# Patient Record
Sex: Female | Born: 1960 | Race: White | Hispanic: No | Smoking: Never smoker
Health system: Southern US, Community
[De-identification: ages and names within clinical notes are randomized; demographics above are authoritative.]

## PROBLEM LIST (undated history)

## (undated) DIAGNOSIS — I749 Embolism and thrombosis of unspecified artery: Secondary | ICD-10-CM

## (undated) DIAGNOSIS — T7840XA Allergy, unspecified, initial encounter: Secondary | ICD-10-CM

## (undated) DIAGNOSIS — R232 Flushing: Secondary | ICD-10-CM

## (undated) DIAGNOSIS — F329 Major depressive disorder, single episode, unspecified: Secondary | ICD-10-CM

## (undated) DIAGNOSIS — M8580 Other specified disorders of bone density and structure, unspecified site: Secondary | ICD-10-CM

## (undated) DIAGNOSIS — F32A Depression, unspecified: Secondary | ICD-10-CM

## (undated) HISTORY — DX: Depression, unspecified: F32.A

## (undated) HISTORY — DX: Allergy, unspecified, initial encounter: T78.40XA

## (undated) HISTORY — DX: Embolism and thrombosis of unspecified artery: I74.9

---
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
## 1997-11-14 HISTORY — PX: ABDOMINAL HYSTERECTOMY: SHX81

## 2019-06-13 ENCOUNTER — Ambulatory Visit: Payer: Self-pay | Admitting: Nurse Practitioner

## 2019-06-28 ENCOUNTER — Ambulatory Visit (INDEPENDENT_AMBULATORY_CARE_PROVIDER_SITE_OTHER): Payer: BC Managed Care – PPO | Admitting: Psychology

## 2019-06-28 DIAGNOSIS — F4321 Adjustment disorder with depressed mood: Secondary | ICD-10-CM

## 2019-07-03 ENCOUNTER — Encounter: Payer: Self-pay | Admitting: Family Medicine

## 2019-07-03 ENCOUNTER — Ambulatory Visit (INDEPENDENT_AMBULATORY_CARE_PROVIDER_SITE_OTHER): Payer: BC Managed Care – PPO | Admitting: Family Medicine

## 2019-07-03 ENCOUNTER — Other Ambulatory Visit: Payer: Self-pay

## 2019-07-03 VITALS — BP 120/80 | HR 70 | Temp 98.1°F | Resp 16 | Ht 64.75 in | Wt 150.6 lb

## 2019-07-03 DIAGNOSIS — Z0000 Encounter for general adult medical examination without abnormal findings: Secondary | ICD-10-CM

## 2019-07-03 DIAGNOSIS — F430 Acute stress reaction: Secondary | ICD-10-CM

## 2019-07-03 DIAGNOSIS — I749 Embolism and thrombosis of unspecified artery: Secondary | ICD-10-CM | POA: Diagnosis not present

## 2019-07-03 DIAGNOSIS — E559 Vitamin D deficiency, unspecified: Secondary | ICD-10-CM | POA: Diagnosis not present

## 2019-07-03 DIAGNOSIS — R232 Flushing: Secondary | ICD-10-CM | POA: Diagnosis not present

## 2019-07-03 DIAGNOSIS — M8580 Other specified disorders of bone density and structure, unspecified site: Secondary | ICD-10-CM | POA: Diagnosis not present

## 2019-07-03 DIAGNOSIS — Z23 Encounter for immunization: Secondary | ICD-10-CM | POA: Diagnosis not present

## 2019-07-03 DIAGNOSIS — Z1239 Encounter for other screening for malignant neoplasm of breast: Secondary | ICD-10-CM

## 2019-07-03 HISTORY — DX: Flushing: R23.2

## 2019-07-03 HISTORY — DX: Other specified disorders of bone density and structure, unspecified site: M85.80

## 2019-07-03 LAB — CBC WITH DIFFERENTIAL/PLATELET
Basophils Absolute: 0.1 10*3/uL (ref 0.0–0.1)
Basophils Relative: 1.5 % (ref 0.0–3.0)
Eosinophils Absolute: 0.1 10*3/uL (ref 0.0–0.7)
Eosinophils Relative: 1.8 % (ref 0.0–5.0)
HCT: 38.6 % (ref 36.0–46.0)
Hemoglobin: 13 g/dL (ref 12.0–15.0)
Lymphocytes Relative: 32.7 % (ref 12.0–46.0)
Lymphs Abs: 1.2 10*3/uL (ref 0.7–4.0)
MCHC: 33.7 g/dL (ref 30.0–36.0)
MCV: 95.6 fl (ref 78.0–100.0)
Monocytes Absolute: 0.3 10*3/uL (ref 0.1–1.0)
Monocytes Relative: 9.4 % (ref 3.0–12.0)
Neutro Abs: 2 10*3/uL (ref 1.4–7.7)
Neutrophils Relative %: 54.6 % (ref 43.0–77.0)
Platelets: 226 10*3/uL (ref 150.0–400.0)
RBC: 4.03 Mil/uL (ref 3.87–5.11)
RDW: 13.4 % (ref 11.5–15.5)
WBC: 3.6 10*3/uL — ABNORMAL LOW (ref 4.0–10.5)

## 2019-07-03 LAB — COMPREHENSIVE METABOLIC PANEL
ALT: 14 U/L (ref 0–35)
AST: 15 U/L (ref 0–37)
Albumin: 4.8 g/dL (ref 3.5–5.2)
Alkaline Phosphatase: 86 U/L (ref 39–117)
BUN: 17 mg/dL (ref 6–23)
CO2: 29 mEq/L (ref 19–32)
Calcium: 9.8 mg/dL (ref 8.4–10.5)
Chloride: 106 mEq/L (ref 96–112)
Creatinine, Ser: 0.84 mg/dL (ref 0.40–1.20)
GFR: 69.97 mL/min (ref >60.00)
Glucose, Bld: 83 mg/dL (ref 70–99)
Potassium: 4.4 mEq/L (ref 3.5–5.1)
Sodium: 142 mEq/L (ref 135–145)
Total Bilirubin: 0.5 mg/dL (ref 0.2–1.2)
Total Protein: 6.9 g/dL (ref 6.0–8.3)

## 2019-07-03 LAB — LIPID PANEL
Cholesterol: 230 mg/dL — ABNORMAL HIGH (ref 0–200)
HDL: 67.5 mg/dL (ref >39.00)
LDL Cholesterol: 149 mg/dL — ABNORMAL HIGH (ref 0–99)
NonHDL: 162.06
Total CHOL/HDL Ratio: 3
Triglycerides: 64 mg/dL (ref 0.0–149.0)
VLDL: 12.8 mg/dL (ref 0.0–40.0)

## 2019-07-03 LAB — TSH: TSH: 1.41 u[IU]/mL (ref 0.35–4.50)

## 2019-07-03 LAB — VITAMIN D 25 HYDROXY (VIT D DEFICIENCY, FRACTURES): VITD: 15.06 ng/mL — ABNORMAL LOW (ref 30.00–100.00)

## 2019-07-03 NOTE — Progress Notes (Signed)
Subjective  Chief Complaint  Patient presents with   Establish Care    Moved from [facility], her PCP was Jocelyn Renfrow   Annual Exam    Last mammo was 2018.Marland Kitchen. Fasting, wants flu vaccine and ask about getting shingles vaccine   Headache    Reports that she is having 6-8 within a month. Takes Excedrin Migraine with relief   Depression    Recently started seeing a therapist    HPI: Tracy Zuniga is a 58 y.o. female who presents to [facility] [place] today for a Female Wellness Visit. She also has the concerns and/or needs as listed above in the chief complaint. These will be addressed in addition to the Health Maintenance Visit.   Wellness Visit: annual visit with health maintenance review and exam without Pap   Healthy 58 yo divorced mother of two grown children s/p partial hysterectomy presents to establish care and for cpe. Due mammogram. Flu shot. Healthy lifestyle. Adopted. Works full time and not currently in a relationship.  Chronic disease f/u and/or acute problem visit: (deemed necessary to be done in addition to the wellness visit):  Stress reaction: had difficult year: daughter recovered from mild substance abuse problem and divorced x 6 years. No h/o depression. No panic sxs. Feels she is doing well things but just wants counseling to sort through some things.   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 Neg coagulopathy workup. Doesn't interfere with sleep. Never thought about treating them. Thinks she can't take estrogens due to clotting history.   Allergies are mid  Headaches: no prior diagnosis or red flag sxs but frequent bitemporal   Assessment  1. Annual physical exam   2. Thromboembolism (HCC)   3. Vasomotor flushing   4. Stress reaction   5. Breast cancer screening   6. Osteopenia, unspecified location   7. Need for immunization against influenza      Plan  Female Wellness  Visit:  Age appropriate Health Maintenance and Prevention measures were discussed with patient. Included topics are cancer screening recommendations, ways to keep healthy (see AVS) including dietary and exercise recommendations, regular eye and dental care, use of seat belts, and avoidance of moderate alcohol use and tobacco use. Mammogram ordered.   BMI: discussed patient's BMI and encouraged positive lifestyle modifications to help get to or maintain a target BMI.  HM needs and immunizations were addressed and ordered. See below for orders. See HM and immunization section for updates. Flu shot today  Routine labs and screening tests ordered including cmp, cbc and lipids where appropriate.  Discussed recommendations regarding Vit D and calcium supplementation (see AVS). Discussed increasing supplements  Chronic disease management visit and/or acute problem visit:  Stress reaction: agree with counseling. No mood disorder.   Hot flushes: trial of otc black cohosh. Consider ssri or gabapentin if needed.   Get old records to clarify embolism history.   Follow up: Return in about 1 year (around 07/02/2020) for complete physical.  Orders Placed This Encounter  Procedures   MM DIGITAL SCREENING BILATERAL   Flu Vaccine QUAD 36+ mos IM   CBC with Differential/Platelet   Comprehensive metabolic panel   Lipid panel   HIV Antibody (routine testing w rflx)   Hepatitis C antibody   VITAMIN D 25 Hydroxy (Vit-D Deficiency, Fractures)   TSH   No orders of the defined types were placed in this encounter.     Lifestyle:  Body mass index is 25.26 kg/m. Wt Readings from Last 3 Encounters:  07/03/19 150 lb 9.6 oz (68.3 kg)    Patient Active Problem List   Diagnosis Date Noted   Vasomotor flushing 07/03/2019   Osteopenia 07/03/2019   Thromboembolism (HCC)     colonic, due to birth control pill    Health Maintenance  Topic Date Due   Hepatitis C Screening  February 04, 1961   HIV  Screening  11/01/1977   MAMMOGRAM  11/14/2018   INFLUENZA VACCINE  06/15/2019   COLONOSCOPY  11/14/2022   TETANUS/TDAP  11/14/2025   Immunization History  Administered Date(s) Administered   Influenza,inj,Quad PF,6+ Mos 07/03/2019   We updated and reviewed the patient's past history in detail and it is documented below. Allergies: Patient has No Known Allergies. Past Medical History Patient  has a past medical history of Allergy, Depression, Osteopenia (07/03/2019), Thromboembolism (HCC), and Vasomotor flushing (07/03/2019). Past Surgical History Patient  has a past surgical history that includes Abdominal hysterectomy (1999). Family History: Patient family history includes Drug abuse in her daughter; Healthy in her son. She was adopted. Social History:  Patient  reports that she has never smoked. She has never used smokeless tobacco. She reports current alcohol use. She reports that she does not use drugs.  Review of Systems: Constitutional: negative for fever or malaise Ophthalmic: negative for photophobia, double vision or loss of vision Cardiovascular: negative for chest pain, dyspnea on exertion, or new LE swelling Respiratory: negative for SOB or persistent cough Gastrointestinal: negative for abdominal pain, change in bowel habits or melena Genitourinary: negative for dysuria or gross hematuria, no abnormal uterine bleeding or disharge Musculoskeletal: negative for new gait disturbance or muscular weakness Integumentary: negative for new or persistent rashes, no breast lumps Neurological: negative for TIA or stroke symptoms Psychiatric: negative for SI or delusions Allergic/Immunologic: negative for hives  Patient Care Team    Relationship Specialty Notifications Start End  Willow OraAndy, Caio Devera L, MD PCP - General Family Medicine  07/03/19     Objective  Vitals: BP 120/80    Pulse 70    Temp 98.1 F (36.7 C) (Oral)    Resp 16    Ht 5' 4.75" (1.645 m)    Wt 150 lb 9.6 oz  (68.3 kg)    SpO2 98%    BMI 25.26 kg/m  General:  Well developed, well nourished, no acute distress  Psych:  Alert and orientedx3,normal mood and affect HEENT:  Normocephalic, atraumatic, non-icteric sclera, PERRL, oropharynx is clear without mass or exudate, supple neck without adenopathy, mass or thyromegaly Cardiovascular:  Normal S1, S2, RRR without gallop, rub or murmur, nondisplaced PMI Respiratory:  Good breath sounds bilaterally, CTAB with normal respiratory effort Gastrointestinal: normal bowel sounds, soft, non-tender, no noted masses. No HSM MSK: no deformities, contusions. Joints are without erythema or swelling. Spine and CVA region are nontender Skin:  Warm, no rashes or suspicious lesions noted Neurologic:    Mental status is normal. CN 2-11 are normal. Gross motor and sensory exams are normal. Normal gait. No tremor Breast Exam: No mass, skin retraction or nipple discharge is appreciated in either breast. No axillary adenopathy. Fibrocystic changes are not noted    Commons side effects, risks, benefits, and alternatives for medications and treatment plan prescribed today were discussed, and the patient expressed understanding of the given instructions. Patient is instructed to call or message via MyChart if he/she has any questions or concerns regarding our treatment plan. No barriers to understanding were identified.  We discussed Red Flag symptoms and signs in detail. Patient expressed understanding regarding what to do in case of urgent or emergency type symptoms.   Medication list was reconciled, printed and provided to the patient in AVS. Patient instructions and summary information was reviewed with the patient as documented in the AVS. This note was prepared with assistance of Dragon voice recognition software. Occasional wrong-word or sound-a-like substitutions may have occurred due to the inherent limitations of voice recognition software

## 2019-07-03 NOTE — Patient Instructions (Addendum)
Please return in 12 months for your annual complete physical; please come fasting. You may make an appointment to further evaluate your headaches and hot flushes if needed.  Try otc Black Cohosh twice a day to see if that helps.   Please sign up for mychart.  I will release your lab results to you on your MyChart account with further instructions. Please reply with any questions.   We will call you with information regarding your referral appointment. Mammogram.  If you do not hear from [place] within the next 2 weeks, please let me know. It can take 1-2 weeks to get appointments set up with the specialists.   It was a pleasure meeting you today! Thank you for choosing [place] to meet your healthcare needs! I truly look forward to working with you. If you have any questions or concerns, please send me a message via Mychart or call the office at [phone].   Calcium Intake Recommendations You can take Caltrate Plus twice a day or get it through your diet or other OTC supplements (Viactiv, OsCal etc)  Calcium is a mineral that affects many functions in the body, including:  Blood clotting.  Blood vessel function.  Nerve impulse conduction.  Hormone secretion.  Muscle contraction.  Bone and teeth functions.  Most of your body's calcium supply is stored in your bones and teeth. When your calcium stores are low, you may be at risk for low bone mass, bone loss, and bone fractures. Consuming enough calcium helps to grow healthy bones and teeth and to prevent breakdown over time. It is very important that you get enough calcium if you are:  A child undergoing rapid growth.  An adolescent girl.  A pre- or post-menopausal woman.  A woman whose menstrual cycle has stopped due to anorexia nervosa or regular intense exercise.  An individual with lactose intolerance or a milk allergy.  A vegetarian.  What is my plan? Try to consume the recommended amount of calcium daily based on your age.  Depending on your overall health, your health care provider may recommend increased calcium intake.General daily calcium intake recommendations by age are:  Birth to 6 months: 200 mg.  Infants 7 to 12 months: 260 mg.  Children 1 to 3 years: 700 mg.  Children 4 to 8 years: 1,000 mg.  Children 9 to 13 years: 1,300 mg.  Teens 14 to 18 years: 1,300 mg.  Adults 19 to 50 years: 1,000 mg.  Adult women 51 to 70 years: 1,200 mg.  Adult men 51 to 70 years: 1,000 mg.  Adults 71 years and older: 1,200 mg.  Pregnant and breastfeeding teens: 1,300 mg.  Pregnant and breastfeeding adults: 1,000 mg.  What do I need to know about calcium intake?  In order for the body to absorb calcium, it needs vitamin D. You can get vitamin D through (we recommend getting [phone] units of Vitamin D daily) ? Direct exposure of the skin to sunlight. ? Foods, such as egg yolks, liver, saltwater fish, and fortified milk. ? Supplements.  Consuming too much calcium may cause: ? Constipation. ? Decreased absorption of iron and zinc. ? Kidney stones.  Calcium supplements may interact with certain medicines. Check with your health care provider before starting any calcium supplements.  Try to get most of your calcium from food. What foods can I eat? Grains  Fortified oatmeal. Fortified ready-to-eat cereals. Fortified frozen waffles. Vegetables Turnip greens. Broccoli. Fruits Fortified orange juice. Meats and Other Protein Sources Canned sardines  with bones. Canned salmon with bones. Soy beans. Tofu. Baked beans. Almonds. [place] nuts. Sunflower seeds. Dairy Milk. Yogurt. Cheese. Cottage cheese. Beverages Fortified soy milk. Fortified rice milk. Sweets/Desserts Pudding. Ice Cream. Milkshakes. Blackstrap molasses. The items listed above may not be a complete list of recommended foods or beverages. Contact your dietitian for more options. What foods can affect my calcium intake? It may be more  difficult for your body to use calcium or calcium may leave your body more quickly if you consume large amounts of:  Sodium.  Protein.  Caffeine.  Alcohol.  This information is not intended to replace advice given to you by your health care provider. Make sure you discuss any questions you have with your health care provider. Document Released: 06/14/2004 Document Revised: 05/20/2016 Document Reviewed: 04/08/2014 Elsevier Interactive Patient Education  2018 Elsevier Inc.   Preventive Care 14-49 Years Old, Female Preventive care refers to visits with your health care provider and lifestyle choices that can promote health and wellness. This includes:  A yearly physical exam. This may also be called an annual well check.  Regular dental visits and eye exams.  Immunizations.  Screening for certain conditions.  Healthy lifestyle choices, such as eating a healthy diet, getting regular exercise, not using drugs or products that contain nicotine and tobacco, and limiting alcohol use. What can I expect for my preventive care visit? Physical exam Your health care provider will check your:  Height and weight. This may be used to calculate body mass index (BMI), which tells if you are at a healthy weight.  Heart rate and blood pressure.  Skin for abnormal spots. Counseling Your health care provider may ask you questions about your:  Alcohol, tobacco, and drug use.  Emotional well-being.  Home and relationship well-being.  Sexual activity.  Eating habits.  Work and work Statistician.  Method of birth control.  Menstrual cycle.  Pregnancy history. What immunizations do I need?  Influenza (flu) vaccine  This is recommended every year. Tetanus, diphtheria, and pertussis (Tdap) vaccine  You may need a Td booster every 10 years. Varicella (chickenpox) vaccine  You may need this if you have not been vaccinated. Zoster (shingles) vaccine  You may need this after age  30. Measles, mumps, and rubella (MMR) vaccine  You may need at least one dose of MMR if you were born in 1957 or later. You may also need a second dose. Pneumococcal conjugate (PCV13) vaccine  You may need this if you have certain conditions and were not previously vaccinated. Pneumococcal polysaccharide (PPSV23) vaccine  You may need one or two doses if you smoke cigarettes or if you have certain conditions. Meningococcal conjugate (MenACWY) vaccine  You may need this if you have certain conditions. Hepatitis A vaccine  You may need this if you have certain conditions or if you travel or work in places where you may be exposed to hepatitis A. Hepatitis B vaccine  You may need this if you have certain conditions or if you travel or work in places where you may be exposed to hepatitis B. Haemophilus influenzae type b (Hib) vaccine  You may need this if you have certain conditions. Human papillomavirus (HPV) vaccine  If recommended by your health care provider, you may need three doses over 6 months. You may receive vaccines as individual doses or as more than one vaccine together in one shot (combination vaccines). Talk with your health care provider about the risks and benefits of combination vaccines. What tests  do I need? Blood tests  Lipid and cholesterol levels. These may be checked every 5 years, or more frequently if you are over 14 years old.  Hepatitis C test.  Hepatitis B test. Screening  Lung cancer screening. You may have this screening every year starting at age 68 if you have a 30-pack-year history of smoking and currently smoke or have quit within the past 15 years.  Colorectal cancer screening. All adults should have this screening starting at age 85 and continuing until age 25. Your health care provider may recommend screening at age 52 if you are at increased risk. You will have tests every 1-10 years, depending on your results and the type of screening  test.  Diabetes screening. This is done by checking your blood sugar (glucose) after you have not eaten for a while (fasting). You may have this done every 1-3 years.  Mammogram. This may be done every 1-2 years. Talk with your health care provider about when you should start having regular mammograms. This may depend on whether you have a family history of breast cancer.  BRCA-related cancer screening. This may be done if you have a family history of breast, ovarian, tubal, or peritoneal cancers.  Pelvic exam and Pap test. This may be done every 3 years starting at age 84. Starting at age 4, this may be done every 5 years if you have a Pap test in combination with an HPV test. Other tests  Sexually transmitted disease (STD) testing.  Bone density scan. This is done to screen for osteoporosis. You may have this scan if you are at high risk for osteoporosis. Follow these instructions at home: Eating and drinking  Eat a diet that includes fresh fruits and vegetables, whole grains, lean protein, and low-fat dairy.  Take vitamin and mineral supplements as recommended by your health care provider.  Do not drink alcohol if: ? Your health care provider tells you not to drink. ? You are pregnant, may be pregnant, or are planning to become pregnant.  If you drink alcohol: ? Limit how much you have to 0-1 drink a day. ? Be aware of how much alcohol is in your drink. In the [place] one drink equals one 12 oz bottle of beer (355 mL), one 5 oz glass of wine (148 mL), or one 1 oz glass of hard liquor (44 mL). Lifestyle  Take daily care of your teeth and gums.  Stay active. Exercise for at least 30 minutes on 5 or more days each week.  Do not use any products that contain nicotine or tobacco, such as cigarettes, e-cigarettes, and chewing tobacco. If you need help quitting, ask your health care provider.  If you are sexually active, practice safe sex. Use a condom or other form of birth control  (contraception) in order to prevent pregnancy and STIs (sexually transmitted infections).  If told by your health care provider, take low-dose aspirin daily starting at age 61. What's next?  Visit your health care provider once a year for a well check visit.  Ask your health care provider how often you should have your eyes and teeth checked.  Stay up to date on all vaccines. This information is not intended to replace advice given to you by your health care provider. Make sure you discuss any questions you have with your health care provider. Document Released: 11/27/2015 Document Revised: 07/12/2018 Document Reviewed: 07/12/2018 Elsevier Patient Education  2020 [employer].

## 2019-07-04 LAB — HIV ANTIBODY (ROUTINE TESTING W REFLEX): HIV 1&2 Ab, 4th Generation: NONREACTIVE

## 2019-07-04 LAB — HEPATITIS C ANTIBODY
Hepatitis C Ab: NONREACTIVE
SIGNAL TO CUT-OFF: 0.01 (ref <1.00)

## 2019-07-04 MED ORDER — VITAMIN D (ERGOCALCIFEROL) 1.25 MG (50000 UNIT) PO CAPS: 50000.0000 [IU] | ORAL_CAPSULE | ORAL | 0 refills | Status: AC

## 2019-07-04 NOTE — Addendum Note (Signed)
Addended by: Billey Chang on: 07/04/2019 04:28 PM   Modules accepted: Orders

## 2019-07-12 ENCOUNTER — Ambulatory Visit (INDEPENDENT_AMBULATORY_CARE_PROVIDER_SITE_OTHER): Payer: 59 | Admitting: Psychology

## 2019-07-12 DIAGNOSIS — F4321 Adjustment disorder with depressed mood: Secondary | ICD-10-CM

## 2019-07-18 ENCOUNTER — Telehealth: Payer: Self-pay | Admitting: Family Medicine

## 2019-07-18 NOTE — Telephone Encounter (Signed)
Patient is calling to receive a referral for a mamogram.   Patient will back on Tuesday to Follow up Holly- [phone]

## 2019-07-19 ENCOUNTER — Other Ambulatory Visit: Payer: Self-pay | Admitting: *Deleted

## 2019-07-19 DIAGNOSIS — Z1239 Encounter for other screening for malignant neoplasm of breast: Secondary | ICD-10-CM

## 2019-07-19 NOTE — Telephone Encounter (Signed)
Did you try to call pt about mammogram?

## 2019-07-19 NOTE — Telephone Encounter (Signed)
I did not call her.  Looking at the order, she will not receive a call about the mammogram from anyone because the preferred location was put in as External.  This needs to be changed to [facility] ( or where ever it is that she wants to go.)

## 2019-07-19 NOTE — Telephone Encounter (Signed)
Order fixed.  

## 2019-07-26 ENCOUNTER — Ambulatory Visit (INDEPENDENT_AMBULATORY_CARE_PROVIDER_SITE_OTHER): Payer: 59 | Admitting: Psychology

## 2019-07-26 DIAGNOSIS — F4321 Adjustment disorder with depressed mood: Secondary | ICD-10-CM

## 2019-08-02 ENCOUNTER — Ambulatory Visit (HOSPITAL_COMMUNITY)
Admission: EM | Admit: 2019-08-02 | Discharge: 2019-08-02 | Disposition: A | Discharge status: Home / Self Care | Payer: BC Managed Care – PPO | Attending: Family Medicine | Admitting: Family Medicine

## 2019-08-02 ENCOUNTER — Ambulatory Visit (INDEPENDENT_AMBULATORY_CARE_PROVIDER_SITE_OTHER): Payer: BC Managed Care – PPO

## 2019-08-02 ENCOUNTER — Encounter (HOSPITAL_COMMUNITY): Payer: Self-pay

## 2019-08-02 ENCOUNTER — Ambulatory Visit (HOSPITAL_COMMUNITY): Payer: BC Managed Care – PPO

## 2019-08-02 DIAGNOSIS — S43421A Sprain of right rotator cuff capsule, initial encounter: Secondary | ICD-10-CM

## 2019-08-02 DIAGNOSIS — S4991XA Unspecified injury of right shoulder and upper arm, initial encounter: Secondary | ICD-10-CM | POA: Diagnosis not present

## 2019-08-02 DIAGNOSIS — M25511 Pain in right shoulder: Secondary | ICD-10-CM | POA: Diagnosis not present

## 2019-08-02 MED ORDER — PREDNISONE 20 MG PO TABS: 20.0000 mg | ORAL_TABLET | Freq: Two times a day (BID) | ORAL | 0 refills | Status: AC

## 2019-08-02 MED ORDER — NAPROXEN 500 MG PO TABS: 500.0000 mg | ORAL_TABLET | Freq: Two times a day (BID) | ORAL | 0 refills | Status: AC

## 2019-08-02 NOTE — ED Triage Notes (Signed)
Patient report she fall on her right side 2 weeks ago,  she was walking her dog, she have have right arm and right shoulder pain, her range of motion is limited because of the pain.

## 2019-08-02 NOTE — Discharge Instructions (Addendum)
Take prednisone 2 times a day for 5 days.  Take 2 doses today When finished with prednisone take Naprosyn 3 times a day.  Take with food Take Naprosyn until the pain in your shoulder is improved Do rotator cuff stretching and exercises as tolerated Follow-up with an orthopedic if you fail to improve

## 2019-08-02 NOTE — ED Provider Notes (Signed)
[place]    CSN: [id number] Arrival date & time: 08/02/19  1021      History   Chief Complaint Chief Complaint  Patient presents with  . Arm Pain    HPI Tracy Zuniga is a 58 y.o. female.   HPI Patient was walking a large dog 2 weeks ago when it took off and she was holding the leash in her right hand.  It pulled her forward and she landed on her elbow and injured her elbow and shoulder.  She has a healing wound on her elbow.  She still has pain in her shoulder.  She still has limited shoulder movement.  It hurts on the top of her shoulder down to the mid deltoid region. Normally healthy.  She does not have any problems with her shoulder previously  Past Medical History:  Diagnosis Date  . Allergy   . Depression   . Osteopenia 07/03/2019  . 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 Vasomotor flushing 07/03/2019    Patient Active Problem List   Diagnosis Date Noted  . Vasomotor flushing 07/03/2019  . Osteopenia 07/03/2019  . Thromboembolism [facility])     Past Surgical History:  Procedure Laterality Date  . ABDOMINAL HYSTERECTOMY  1999   Partial    OB History   No obstetric history on file.      Home Medications    Prior to Admission medications   Medication Sig Start Date End Date Taking? Authorizing Provider  ibuprofen (ADVIL) 200 MG tablet Take 200 mg by mouth every 6 (six) hours as needed.   Yes [provider]  Multiple Vitamin (MULTIVITAMIN) tablet Take 1 tablet by mouth daily.   Yes [provider]  Vitamin D, Ergocalciferol, (DRISDOL) 1.25 MG (50000 UT) CAPS capsule Take 1 capsule (50,000 Units total) by mouth once a week. 07/04/19 10/02/19  Leamon Arnt, MD    Family History Family History  Adopted: Yes  Problem Relation Age of Onset  . Drug abuse Daughter   . Healthy Son     Social History Social History   Tobacco Use  . Smoking status: Never Smoker  . Smokeless tobacco: Never Used   Substance Use Topics  . Alcohol use: Yes  . Drug use: Never     Allergies   Patient has no known allergies.   Review of Systems Review of Systems  Constitutional: Negative for chills and fever.  HENT: Negative for ear pain and sore throat.   Eyes: Negative for pain and visual disturbance.  Respiratory: Negative for cough and shortness of breath.   Cardiovascular: Negative for chest pain and palpitations.  Gastrointestinal: Negative for abdominal pain and vomiting.  Genitourinary: Negative for dysuria and hematuria.  Musculoskeletal: Positive for arthralgias. Negative for back pain.  Skin: Negative for color change and rash.  Neurological: Negative for seizures and syncope.  All other systems reviewed and are negative.    Physical Exam Triage Vital Signs ED Triage Vitals  Enc Vitals Group     BP 08/02/19 1032 133/84     Pulse Rate 08/02/19 1032 80     Resp 08/02/19 1032 16     Temp 08/02/19 1032 98 F (36.7 C)     Temp Source 08/02/19 1032 Temporal     SpO2 08/02/19 1032 99 %     Weight --      Height --      Head Circumference --      Peak  Flow --      Pain Score 08/02/19 1035 6     Pain Loc --      Pain Edu? --      Excl. in GC? --    No data found.  Updated Vital Signs BP 133/84 (BP Location: Left Arm)   Pulse 80   Temp 98 F (36.7 C) (Temporal)   Resp 16   SpO2 99%       Physical Exam Constitutional:      General: She is not in acute distress.    Appearance: She is well-developed.  HENT:     Head: Normocephalic and atraumatic.  Eyes:     Conjunctiva/sclera: Conjunctivae normal.     Pupils: Pupils are equal, round, and reactive to light.  Neck:     Musculoskeletal: Normal range of motion.  Cardiovascular:     Rate and Rhythm: Normal rate.  Pulmonary:     Effort: Pulmonary effort is normal. No respiratory distress.  Abdominal:     General: There is no distension.     Palpations: Abdomen is soft.  Musculoskeletal: Normal range of motion.      Comments: Right shoulder is not swollen.  There is no tenderness around the joint.  No tenderness over the [facility] joint or clavicle.  She can abduct to 90 degrees.  She has full external rotation but very little internal rotation.  Negative impingement.  Elbow wrist and hand exam is normal.  Skin:    General: Skin is warm and dry.  Neurological:     Mental Status: She is alert.      UC Treatments / Results  Labs (all labs ordered are listed, but only abnormal results are displayed) Labs Reviewed - No data to display  EKG   Radiology Dg Shoulder Right  Result Date: 08/02/2019 CLINICAL DATA:  Right shoulder pain after fall 2 weeks ago EXAM: RIGHT SHOULDER - 2+ VIEW COMPARISON:  None. FINDINGS: There is no evidence of fracture or dislocation. There is no evidence of arthropathy or other focal bone abnormality. Soft tissues are unremarkable. IMPRESSION: Negative. Electronically Signed   By: Lupita RaiderJames  Green Jr M.D.   On: 08/02/2019 11:35    Procedures Procedures (including critical care time)  Medications Ordered in UC Medications - No data to display  Initial Impression / Assessment and Plan / UC Course  I have reviewed the triage vital signs and the nursing notes.  Pertinent labs & imaging results that were available during my care of the patient were reviewed by me and considered in my medical decision making (see chart for details).     Patient is given a rotator cuff strain handout with exercises recommended.  These are demonstrated to her and discussed. Final Clinical Impressions(s) / UC Diagnoses   Final diagnoses:  Sprain of right rotator cuff capsule, initial encounter     Discharge Instructions     Take prednisone 2 times a day for 5 days.  Take 2 doses today When finished with prednisone take Naprosyn 3 times a day.  Take with food Take Naprosyn until the pain in your shoulder is improved Do rotator cuff stretching and exercises as tolerated Follow-up with an  orthopedic if you fail to improve    ED Prescriptions    None     PDMP not reviewed this encounter.   Eustace MooreNelson, Azai Gaffin Sue, MD 08/02/19 [id number]

## 2019-08-09 ENCOUNTER — Ambulatory Visit (INDEPENDENT_AMBULATORY_CARE_PROVIDER_SITE_OTHER): Payer: 59 | Admitting: Psychology

## 2019-08-09 DIAGNOSIS — F4321 Adjustment disorder with depressed mood: Secondary | ICD-10-CM

## 2019-08-22 ENCOUNTER — Ambulatory Visit (INDEPENDENT_AMBULATORY_CARE_PROVIDER_SITE_OTHER): Payer: 59 | Admitting: Psychology

## 2019-08-22 DIAGNOSIS — F4321 Adjustment disorder with depressed mood: Secondary | ICD-10-CM | POA: Diagnosis not present

## 2019-09-05 ENCOUNTER — Ambulatory Visit (INDEPENDENT_AMBULATORY_CARE_PROVIDER_SITE_OTHER): Payer: 59 | Admitting: Psychology

## 2019-09-05 DIAGNOSIS — F4321 Adjustment disorder with depressed mood: Secondary | ICD-10-CM | POA: Diagnosis not present

## 2019-09-17 ENCOUNTER — Ambulatory Visit (INDEPENDENT_AMBULATORY_CARE_PROVIDER_SITE_OTHER): Payer: BC Managed Care – PPO | Admitting: Orthopaedic Surgery

## 2019-09-17 ENCOUNTER — Other Ambulatory Visit: Payer: Self-pay

## 2019-09-17 ENCOUNTER — Encounter: Payer: Self-pay | Admitting: Orthopaedic Surgery

## 2019-09-17 DIAGNOSIS — G8929 Other chronic pain: Secondary | ICD-10-CM

## 2019-09-17 DIAGNOSIS — M25511 Pain in right shoulder: Secondary | ICD-10-CM

## 2019-09-17 MED ORDER — METHYLPREDNISOLONE ACETATE 40 MG/ML IJ SUSP
40.0000 mg | INTRAMUSCULAR | Status: AC | PRN
  Administered 2019-09-17: 40 mg via INTRA_ARTICULAR

## 2019-09-17 MED ORDER — BUPIVACAINE HCL 0.25 % IJ SOLN
2.0000 mL | INTRAMUSCULAR | Status: AC | PRN
  Administered 2019-09-17: 2 mL via INTRA_ARTICULAR

## 2019-09-17 MED ORDER — LIDOCAINE HCL 2 % IJ SOLN
2.0000 mL | INTRAMUSCULAR | Status: AC | PRN
  Administered 2019-09-17: 2 mL

## 2019-09-17 NOTE — Progress Notes (Signed)
Office Visit Note   Patient: Tracy Zuniga           Date of Birth: [birth year]           MRN: [id number] Visit Date: 09/17/2019              Requested by: Leamon Arnt, [place],  [place] [address] PCP: Leamon Arnt, MD   Assessment & Plan: Visit Diagnoses:  1. Chronic right shoulder pain     Plan: Impression is right shoulder subacromial bursitis possible partial rotator cuff tear with relatively well preserved strength.  We will inject this with cortisone today.  Will also send her to outpatient PT.  She will follow-up with [place] as needed.  Call with concerns or questions in the meantime.  Follow-Up Instructions: Return if symptoms worsen or fail to improve.   Orders:  Orders Placed This Encounter  Procedures   Large Joint Inj: R subacromial bursa   No orders of the defined types were placed in this encounter.     Procedures: Large Joint Inj: R subacromial bursa on 09/17/2019 10:04 AM Indications: pain Details: 22 G needle Medications: 2 mL bupivacaine 0.25 %; 2 mL lidocaine 2 %; 40 mg methylPREDNISolone acetate 40 MG/ML Outcome: tolerated well, no immediate complications Patient was prepped and draped in the usual sterile fashion.       Clinical Data: No additional findings.   Subjective: Chief Complaint  Patient presents with   Right Shoulder - Pain    HPI patient is a pleasant 58 year old female who comes in today with right shoulder pain.  Approximately 2 months ago she was walking her daughter's dog holding the leash when the dog jerked forward pulling her right arm and causing her to fall onto her right elbow.  She has had pain to the right shoulder since.  The pain actually is primarily into the deltoid.  She has increased pain with internal and external rotation as well as forward flexion.  She has been taking naproxen with good relief of symptoms.  No numbness, tingling or burning.  She was initially seen at the urgent care  where x-rays were obtained.  No acute findings there.  She comes in today for further evaluation.  Review of Systems as detailed in HPI.  All others reviewed and are negative.   Objective: Vital Signs: There were no vitals taken for this visit.  Physical Exam well-developed well-nourished female no acute distress.  Alert and oriented x3.  Ortho Exam examination of the right shoulder reveals full active range of motion all planes.  She has increased pain with external rotation and abduction.  Minimally positive empty can.  Negative cross body.  No tenderness to the bicipital groove.  She is neurovascularly intact distally.  Specialty Comments:  No specialty comments available.  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

## 2019-09-18 ENCOUNTER — Encounter: Payer: Self-pay | Admitting: Physical Therapy

## 2019-09-18 ENCOUNTER — Ambulatory Visit (INDEPENDENT_AMBULATORY_CARE_PROVIDER_SITE_OTHER): Payer: BC Managed Care – PPO | Admitting: Physical Therapy

## 2019-09-18 DIAGNOSIS — R293 Abnormal posture: Secondary | ICD-10-CM | POA: Diagnosis not present

## 2019-09-18 DIAGNOSIS — M25511 Pain in right shoulder: Secondary | ICD-10-CM

## 2019-09-18 DIAGNOSIS — M6281 Muscle weakness (generalized): Secondary | ICD-10-CM

## 2019-09-18 NOTE — Therapy (Signed)
[facility] Physical Therapy [address] [place], [place], [address] Phone: [phone]   Fax:  [phone]  Physical Therapy Evaluation  Patient Details  Name: Tyrell Brereton MRN: [id number] Date of Birth: [birth year] Referring Provider (PT): Tarry Kos, MD   Encounter Date: 09/18/2019  PT End of Session - 09/18/19 1254    Visit Number  1    Number of Visits  12    Date for PT Re-Evaluation  10/30/19    PT Start Time  0850    PT Stop Time  0928    PT Time Calculation (min)  38 min    Activity Tolerance  Patient tolerated treatment well    Behavior During Therapy  [facility] for tasks assessed/performed       Past Medical History:  Diagnosis Date  . Allergy   . Depression   . Osteopenia 07/03/2019  . Thromboembolism ([facility])    colonic, due to birth control pill  . Vasomotor flushing 07/03/2019    Past Surgical History:  Procedure Laterality Date  . ABDOMINAL HYSTERECTOMY  1999   Partial    There were no vitals filed for this visit.   Subjective Assessment - 09/18/19 0853    Subjective  Pt is a 58 y/o female who presents to OPPT with Rt shoulder pain x 2 months.  Pt reports she was walking her daughter's dog when the dog pulled on her arm causing her to fall.  She had injection yesterday, which has helped with the pain some.    Patient Stated Goals  improve pain; restore normal function of Rt arm    Currently in Pain?  Yes    Pain Score  0-No pain   up to 7/10   Pain Location  Shoulder    Pain Orientation  Right    Pain Descriptors / Indicators  Shooting;Aching;Dull    Pain Type  Acute pain    Pain Onset  More than a month ago    Pain Frequency  Intermittent    Aggravating Factors   overhead reaching, external rotation activities, carrying heavy items (heavy tote bag)    Pain Relieving Factors  prednisone, injection         [facility] PT Assessment - 09/18/19 0859      Assessment   Medical Diagnosis  M25.511,G89.29 (ICD-10-CM) - Chronic right  shoulder pain    Referring Provider (PT)  Tarry Kos, MD    Onset Date/Surgical Date  --   2 months ago   Hand Dominance  Right    Next MD Visit  PRN    Prior Therapy  none      Precautions   Precautions  None      Restrictions   Weight Bearing Restrictions  No      Balance Screen   Has the patient fallen in the past 6 months  Yes    How many times?  1    Has the patient had a decrease in activity level because of a fear of falling?   No    Is the patient reluctant to leave their home because of a fear of falling?   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    Leisure  spend time  with children, crafts; no regular exercise      Cognition   Overall Cognitive Status  Within Functional Limits for tasks assessed    Area of Impairment  --      Posture/Postural Control   Posture/Postural Control  Postural limitations    Postural Limitations  Rounded Shoulders;Forward head    Posture Comments  Rt mild scapular winging      ROM / Strength   AROM / PROM / Strength  AROM;Strength      AROM   Overall AROM Comments  bil shoulders grossly WNL; mild discomfort and lacking end range flexion on Rt      Strength   Strength Assessment Site  Shoulder    Right/Left Shoulder  Right    Right Shoulder Flexion  4/5    Right Shoulder ABduction  3+/5   with pain   Right Shoulder Internal Rotation  5/5    Right Shoulder External Rotation  4/5      Palpation   Palpation comment  trigger points noted in Rt deltoids, infraspinatus and teres minor; large trigger point noted in upper trap with likely trigger points into supraspinatus      Special Tests    Special Tests  Rotator Cuff Impingement    Rotator Cuff Impingment tests  Michel Bickers test;Empty Can test;Full Can test       Hawkins-Kennedy test   Findings  Negative    Side  Right      Empty Can test   Findings  Positive    Side  Right      Full Can test   Findings  Positive    Side  Right                Objective measurements completed on examination: See above findings.      [facility] Adult PT Treatment/Exercise - 09/18/19 0859      Exercises   Exercises  Shoulder      Shoulder Exercises: Seated   Retraction  5 reps    Retraction Limitations  5 sec    External Rotation  Both;5 reps    External Rotation Limitations  3-5 sec hold    Other Seated Exercises  backward shoulder rolls x 5 reps      Shoulder Exercises: Standing   External Rotation  Both;5 reps;Theraband    Theraband Level (Shoulder External Rotation)  Level 1 (Yellow)    Row  Both;5 reps;Theraband    Theraband Level (Shoulder Row)  Level 1 (Yellow)      Shoulder Exercises: Stretch   Other Shoulder Stretches  low doorway stretch 1x30 sec; attempted mid - too painful at this time             PT Education - 09/18/19 1254    Education Details  HEP, DN    Person(s) Educated  Patient    Methods  Explanation;Demonstration;Handout    Comprehension  Verbalized understanding;Returned demonstration;Need further instruction          PT Long Term Goals - 09/18/19 1259      PT LONG TERM GOAL #1   Title  independent with HEP    Status  New    Target Date  10/30/19      PT LONG TERM GOAL #2   Title  perform Rt shoulder AROM without pain for improved function    Status  New    Target Date  10/30/19      PT LONG TERM GOAL #3   Title  report no difficulty with carrying > 15# for improved function    Status  New    Target Date  10/30/19      PT LONG TERM GOAL #4   Title  report pain < 3/10 with overhead reaching and external rotation with abduction for improved function    Status  New    Target Date  10/30/19      PT LONG TERM GOAL #5   Title  demonstrate 4/5 Rt shoulder strength for improved function     Status  New    Target Date  10/30/19             Plan - 09/18/19 1255    Clinical Impression Statement  Pt is a 58 y/o female who presents to OPPT for Rt shoulder pain, most consistent with rotator cuff syndrome involving supraspinatus.  Pt demonstrates increased pain, postural abnormalities, and decreased strength affecting functional mobility.  Pt will benefit from PT to address deficits listed.    Examination-Activity Limitations  Reach Overhead;Carry;Toileting;Dressing;Hygiene/Grooming    Examination-Participation Restrictions  Driving;Other   occupation   Stability/Clinical Decision Making  Stable/Uncomplicated    Clinical Decision Making  Low    Rehab Potential  Good    PT Frequency  2x / week    PT Duration  6 weeks    PT Treatment/Interventions  ADLs/Self Care Home Management;Cryotherapy;Electrical Stimulation;Ultrasound;Moist Heat;Iontophoresis 4mg /ml Dexamethasone;Functional mobility training;Therapeutic activities;Therapeutic exercise;Patient/family education;Manual techniques;Passive range of motion;Taping;Vasopneumatic Device;Dry needling    PT Next Visit Plan  review HEP, progress strengthening as able; manual/modalities PRN, DN to deltoids/upper trap/supraspinatus/teres minor and infraspinatus    PT Home Exercise Plan  Access Code: [email or web address]    Consulted and Agree with Plan of Care  Patient       Patient will benefit from skilled therapeutic intervention in order to improve the following deficits and impairments:  Decreased range of motion, Increased fascial restricitons, Increased muscle spasms, Pain, Impaired UE functional use, Postural dysfunction, Decreased strength  Visit Diagnosis: Acute pain of right shoulder - Plan: PT plan of care cert/re-cert  Abnormal posture - Plan: PT plan of care cert/re-cert  Muscle weakness (generalized) - Plan: PT plan of care cert/re-cert     Problem List Patient Active Problem List   Diagnosis Date Noted  . Vasomotor  flushing 07/03/2019  . Osteopenia 07/03/2019  . Thromboembolism [facility](HCC)       Clarita CraneStephanie F Teegan Brandis, PT, DPT 09/18/19 1:03 PM     [facility] [address] [place], [place], [address] Phone: [phone]   Fax:  [phone]  Name: Lacie DraftDonna Pelaez MRN: [id number] Date of Birth: 1961/09/14

## 2019-09-18 NOTE — Patient Instructions (Signed)
Access Code: [email or web address]  URL: [email or web address]  Date: 09/18/2019  Prepared by: Faustino Congress   Exercises  Doorway Pec Stretch at 90 Degrees Abduction - 3 reps - 1 sets - 30 sec hold - 1x daily - 7x weekly  Supine Chest Stretch on Foam Roll - 1 reps - 1 sets - 3-5 min hold - 1x daily - 7x weekly  Seated Scapular Retraction - 2-3 reps - 1 sets - 5 sec hold - 7x daily - 7x weekly  Shoulder External Rotation and Scapular Retraction - 2-3 reps - 1 sets - 1-2 sec hold - 7x daily - 7x weekly  Standing Backward Shoulder Rolls - 2-3 reps - 1 sets - 7x daily - 7x weekly  Scapular Retraction with Resistance - 10 reps - 1 sets - 5 sec hold - 2x daily - 7x weekly  Standing Shoulder External Rotation with Resistance - 10 reps - 1 sets - 1-2 sec hold - 1x daily - 7x weekly  Patient Education  Trigger Point Dry Needling

## 2019-09-19 ENCOUNTER — Ambulatory Visit
Admission: RE | Admit: 2019-09-19 | Discharge: 2019-09-19 | Disposition: A | Discharge status: Home / Self Care | Payer: BC Managed Care – PPO | Source: Ambulatory Visit | Attending: Family Medicine | Admitting: Family Medicine

## 2019-09-19 ENCOUNTER — Other Ambulatory Visit: Payer: Self-pay

## 2019-09-19 DIAGNOSIS — Z1231 Encounter for screening mammogram for malignant neoplasm of breast: Secondary | ICD-10-CM | POA: Diagnosis not present

## 2019-09-19 DIAGNOSIS — Z1239 Encounter for other screening for malignant neoplasm of breast: Secondary | ICD-10-CM

## 2019-09-23 ENCOUNTER — Other Ambulatory Visit: Payer: Self-pay | Admitting: Family Medicine

## 2019-09-25 ENCOUNTER — Ambulatory Visit (INDEPENDENT_AMBULATORY_CARE_PROVIDER_SITE_OTHER): Payer: BC Managed Care – PPO | Admitting: Physical Therapy

## 2019-09-25 ENCOUNTER — Other Ambulatory Visit: Payer: Self-pay

## 2019-09-25 ENCOUNTER — Encounter: Payer: Self-pay | Admitting: Physical Therapy

## 2019-09-25 DIAGNOSIS — M25511 Pain in right shoulder: Secondary | ICD-10-CM | POA: Diagnosis not present

## 2019-09-25 DIAGNOSIS — M6281 Muscle weakness (generalized): Secondary | ICD-10-CM | POA: Diagnosis not present

## 2019-09-25 DIAGNOSIS — R293 Abnormal posture: Secondary | ICD-10-CM

## 2019-09-25 NOTE — Therapy (Signed)
[facility] Physical Therapy [address] [place], [place], [address] Phone: [phone]   Fax:  [phone]  Physical Therapy Treatment  Patient Details  Name: Tracy Zuniga MRN: [id number] Date of Birth: 03/24/1961 Referring Provider (PT): Tarry Kos, MD   Encounter Date: 09/25/2019  PT End of Session - 09/25/19 1229    Visit Number  2    Number of Visits  12    Date for PT Re-Evaluation  10/30/19    PT Start Time  0931    PT Stop Time  1015    PT Time Calculation (min)  44 min    Activity Tolerance  Patient tolerated treatment well    Behavior During Therapy  [facility] for tasks assessed/performed       Past Medical History:  Diagnosis Date  . Allergy   . Depression   . Osteopenia 07/03/2019  . Thromboembolism ([facility])    colonic, due to birth control pill  . Vasomotor flushing 07/03/2019    Past Surgical History:  Procedure Laterality Date  . ABDOMINAL HYSTERECTOMY  1999   Partial    There were no vitals filed for this visit.  Subjective Assessment - 09/25/19 0936    Subjective  shoulder is feeling better; still having some pain with overhead movements.  exercises going well.    Patient Stated Goals  improve pain; restore normal function of Rt arm    Currently in Pain?  No/denies    Pain Onset  More than a month ago                       [facility] Adult PT Treatment/Exercise - 09/25/19 0937      Shoulder Exercises: Standing   Horizontal ABduction  Left;10 reps;Weights    Horizontal ABduction Weight (lbs)  3   bar   Internal Rotation  Left;10 reps;Theraband    Theraband Level (Shoulder Internal Rotation)  Level 3 (Green)    Flexion  Right;10 reps;Weights    Shoulder Flexion Weight (lbs)  3   bar   ABduction  Left;10 reps;Weights    Shoulder ABduction Weight (lbs)  3   bar   Extension  Both;10 reps;Theraband    Theraband Level (Shoulder Extension)  Level 3 (Green)    Row  Both;10 reps;Theraband    Theraband Level (Shoulder  Row)  Level 3 (Green)    Other Standing Exercises  overhead press x 10; 3# bar      Shoulder Exercises: ROM/Strengthening   UBE (Upper Arm Bike)  L1 x 4 min (2' each direction)      Shoulder Exercises: Stretch   Other Shoulder Stretches  low/mid/high doorway stretch x30 sec      Manual Therapy   Manual Therapy  Joint mobilization;Soft tissue mobilization    Joint Mobilization  grades 2-3 Rt shoulder A/P and inf mobs    Soft tissue mobilization  Rt upper traps and into deltoids; TPR to upper traps             PT Education - 09/25/19 1229    Education Details  HEP    Person(s) Educated  Patient    Methods  Explanation;Demonstration;Handout    Comprehension  Verbalized understanding;Returned demonstration;Need further instruction          PT Long Term Goals - 09/18/19 1259      PT LONG TERM GOAL #1   Title  independent with HEP    Status  New    Target Date  10/30/19  PT LONG TERM GOAL #2   Title  perform Rt shoulder AROM without pain for improved function    Status  New    Target Date  10/30/19      PT LONG TERM GOAL #3   Title  report no difficulty with carrying > 15# for improved function    Status  New    Target Date  10/30/19      PT LONG TERM GOAL #4   Title  report pain < 3/10 with overhead reaching and external rotation with abduction for improved function    Status  New    Target Date  10/30/19      PT LONG TERM GOAL #5   Title  demonstrate 4/5 Rt shoulder strength for improved function    Status  New    Target Date  10/30/19            Plan - 09/25/19 1230    Clinical Impression Statement  Pt reporting improvement in pain overall and able to progress to standing strengthening exercises without increase in pain.  Still with upper trap trigger points and may benefit from DN, but pt requested to hold for now as she has seen improvement.  Progressing well with PT.    Examination-Activity Limitations  Reach  Overhead;Carry;Toileting;Dressing;Hygiene/Grooming    Examination-Participation Restrictions  Driving;Other   occupation   Stability/Clinical Decision Making  Stable/Uncomplicated    Rehab Potential  Good    PT Frequency  2x / week    PT Duration  6 weeks    PT Treatment/Interventions  ADLs/Self Care Home Management;Cryotherapy;Electrical Stimulation;Ultrasound;Moist Heat;Iontophoresis 4mg /ml Dexamethasone;Functional mobility training;Therapeutic activities;Therapeutic exercise;Patient/family education;Manual techniques;Passive range of motion;Taping;Vasopneumatic Device;Dry needling    PT Next Visit Plan  review HEP, progress strengthening as able; manual/modalities PRN, DN to deltoids/upper trap/supraspinatus/teres minor and infraspinatus    PT Home Exercise Plan  Access Code: [email or web address]    Consulted and Agree with Plan of Care  Patient       Patient will benefit from skilled therapeutic intervention in order to improve the following deficits and impairments:  Decreased range of motion, Increased fascial restricitons, Increased muscle spasms, Pain, Impaired UE functional use, Postural dysfunction, Decreased strength  Visit Diagnosis: Acute pain of right shoulder  Abnormal posture  Muscle weakness (generalized)     Problem List Patient Active Problem List   Diagnosis Date Noted  . Vasomotor flushing 07/03/2019  . Osteopenia 07/03/2019  . Thromboembolism [facility])       Laureen Abrahams, PT, DPT 09/25/19 12:32 PM     [facility] Physical Therapy [address] [place], [place], [address] Phone: [phone]   Fax:  [phone]  Name: Tracy Zuniga MRN: [id number] Date of Birth: 08-Apr-1961

## 2019-09-25 NOTE — Patient Instructions (Signed)
Access Code: [email or web address]  URL: [email or web address]  Date: 09/25/2019  Prepared by: Faustino Congress   Exercises  Doorway Pec Stretch at 90 Degrees Abduction - 3 reps - 1 sets - 30 sec hold - 1x daily - 7x weekly  Supine Chest Stretch on Foam Roll - 1 reps - 1 sets - 3-5 min hold - 1x daily - 7x weekly  Seated Scapular Retraction - 2-3 reps - 1 sets - 5 sec hold - 7x daily - 7x weekly  Shoulder External Rotation and Scapular Retraction - 2-3 reps - 1 sets - 1-2 sec hold - 7x daily - 7x weekly  Standing Backward Shoulder Rolls - 2-3 reps - 1 sets - 7x daily - 7x weekly  Scapular Retraction with Resistance - 10 reps - 1 sets - 5 sec hold - 2x daily - 7x weekly  Standing Shoulder External Rotation with Resistance - 10 reps - 1 sets - 1-2 sec hold - 1x daily - 7x weekly  Single Arm Shoulder Flexion with Dumbbell - 10 reps - 1 sets - 1x daily - 7x weekly  Shoulder Abduction with Dumbbells - Thumbs Up - 10 reps - 1 sets - 1x daily - 7x weekly  Standing Shoulder Horizontal Abduction with Dumbbells - Thumbs Up - 10 reps - 1 sets - 1x daily - 7x weekly  Patient Education  Trigger Point Dry Needling

## 2019-09-26 ENCOUNTER — Ambulatory Visit (INDEPENDENT_AMBULATORY_CARE_PROVIDER_SITE_OTHER): Payer: 59 | Admitting: Psychology

## 2019-09-26 DIAGNOSIS — F4321 Adjustment disorder with depressed mood: Secondary | ICD-10-CM

## 2019-09-27 ENCOUNTER — Other Ambulatory Visit: Payer: Self-pay

## 2019-09-27 ENCOUNTER — Encounter: Payer: Self-pay | Admitting: Physical Therapy

## 2019-09-27 ENCOUNTER — Ambulatory Visit (INDEPENDENT_AMBULATORY_CARE_PROVIDER_SITE_OTHER): Payer: BC Managed Care – PPO | Admitting: Physical Therapy

## 2019-09-27 DIAGNOSIS — M25511 Pain in right shoulder: Secondary | ICD-10-CM | POA: Diagnosis not present

## 2019-09-27 DIAGNOSIS — R293 Abnormal posture: Secondary | ICD-10-CM | POA: Diagnosis not present

## 2019-09-27 DIAGNOSIS — M6281 Muscle weakness (generalized): Secondary | ICD-10-CM

## 2019-09-27 NOTE — Therapy (Signed)
[facility] Physical Therapy [address] [place], [place], [address] Phone: [phone]   Fax:  [phone]  Physical Therapy Treatment  Patient Details  Name: Tracy Zuniga MRN: [id number] Date of Birth: 14-Jun-1961 Referring Provider (PT): Tarry Kos, MD   Encounter Date: 09/27/2019  PT End of Session - 09/27/19 1023    Visit Number  3    Number of Visits  12    Date for PT Re-Evaluation  10/30/19    PT Start Time  0930    PT Stop Time  1012    PT Time Calculation (min)  42 min    Activity Tolerance  Patient tolerated treatment well    Behavior During Therapy  [facility] for tasks assessed/performed       Past Medical History:  Diagnosis Date  . Allergy   . Depression   . Osteopenia 07/03/2019  . Thromboembolism (HCC)    colonic, due to birth control pill  . Vasomotor flushing 07/03/2019    Past Surgical History:  Procedure Laterality Date  . ABDOMINAL HYSTERECTOMY  1999   Partial    There were no vitals filed for this visit.  Subjective Assessment - 09/27/19 0932    Subjective  sleeping is improving, still having some discomfort but overall feels better    Patient Stated Goals  improve pain; restore normal function of Rt arm    Currently in Pain?  No/denies    Pain Onset  More than a month ago                       [facility] Adult PT Treatment/Exercise - 09/27/19 0933      Shoulder Exercises: Standing   External Rotation  Right;15 reps;Theraband    Theraband Level (Shoulder External Rotation)  Level 4 (Blue)    Internal Rotation  15 reps;Theraband;Right    Theraband Level (Shoulder Internal Rotation)  Level 4 (Blue)    Flexion  Right;15 reps;Weights    Shoulder Flexion Weight (lbs)  3   bar   ABduction  Right;15 reps;Weights    Shoulder ABduction Weight (lbs)  3   bar   Extension  Both;15 reps;Theraband    Theraband Level (Shoulder Extension)  Level 4 (Blue)    Row  Both;15 reps;Theraband    Theraband Level (Shoulder  Row)  Level 4 (Blue)    Other Standing Exercises  overhead press x 15; 3# bar    Other Standing Exercises  chest press to overhead x 15 reps; 3# bar      Shoulder Exercises: ROM/Strengthening   UBE (Upper Arm Bike)  L2 x 4 min (2' each direction)      Manual Therapy   Manual Therapy  Joint mobilization;Soft tissue mobilization    Joint Mobilization  grades 2-3 Rt shoulder A/P and inf mobs    Soft tissue mobilization  Rt upper traps and into deltoids; TPR to upper traps                  PT Long Term Goals - 09/18/19 1259      PT LONG TERM GOAL #1   Title  independent with HEP    Status  New    Target Date  10/30/19      PT LONG TERM GOAL #2   Title  perform Rt shoulder AROM without pain for improved function    Status  New    Target Date  10/30/19      PT LONG TERM GOAL #  3   Title  report no difficulty with carrying > 15# for improved function    Status  New    Target Date  10/30/19      PT LONG TERM GOAL #4   Title  report pain < 3/10 with overhead reaching and external rotation with abduction for improved function    Status  New    Target Date  10/30/19      PT LONG TERM GOAL #5   Title  demonstrate 4/5 Rt shoulder strength for improved function    Status  New    Target Date  10/30/19            Plan - 09/27/19 1023    Clinical Impression Statement  Pt tolerated session well today without increase in pain.  Overall feels symptoms are improving.    Examination-Activity Limitations  Reach Overhead;Carry;Toileting;Dressing;Hygiene/Grooming    Examination-Participation Restrictions  Driving;Other   occupation   Stability/Clinical Decision Making  Stable/Uncomplicated    Rehab Potential  Good    PT Frequency  2x / week    PT Duration  6 weeks    PT Treatment/Interventions  ADLs/Self Care Home Management;Cryotherapy;Electrical Stimulation;Ultrasound;Moist Heat;Iontophoresis 4mg /ml Dexamethasone;Functional mobility training;Therapeutic  activities;Therapeutic exercise;Patient/family education;Manual techniques;Passive range of motion;Taping;Vasopneumatic Device;Dry needling    PT Next Visit Plan  review HEP, progress strengthening as able; manual/modalities PRN, DN to deltoids/upper trap/supraspinatus/teres minor and infraspinatus    PT Home Exercise Plan  Access Code: [email or web address]    Consulted and Agree with Plan of Care  Patient       Patient will benefit from skilled therapeutic intervention in order to improve the following deficits and impairments:  Decreased range of motion, Increased fascial restricitons, Increased muscle spasms, Pain, Impaired UE functional use, Postural dysfunction, Decreased strength  Visit Diagnosis: Acute pain of right shoulder  Abnormal posture  Muscle weakness (generalized)     Problem List Patient Active Problem List   Diagnosis Date Noted  . Vasomotor flushing 07/03/2019  . Osteopenia 07/03/2019  . Thromboembolism [facility])       Laureen Abrahams, PT, DPT 09/27/19 10:25 AM     [facility] Physical Therapy [address] [place], [place], [address] Phone: [phone]   Fax:  [phone]  Name: Tracy Zuniga MRN: [id number] Date of Birth: 1961-04-06

## 2019-10-02 ENCOUNTER — Ambulatory Visit (INDEPENDENT_AMBULATORY_CARE_PROVIDER_SITE_OTHER): Payer: BC Managed Care – PPO | Admitting: Physical Therapy

## 2019-10-02 ENCOUNTER — Encounter: Payer: Self-pay | Admitting: Physical Therapy

## 2019-10-02 ENCOUNTER — Other Ambulatory Visit: Payer: Self-pay

## 2019-10-02 DIAGNOSIS — M25511 Pain in right shoulder: Secondary | ICD-10-CM | POA: Diagnosis not present

## 2019-10-02 DIAGNOSIS — M6281 Muscle weakness (generalized): Secondary | ICD-10-CM

## 2019-10-02 DIAGNOSIS — R293 Abnormal posture: Secondary | ICD-10-CM

## 2019-10-02 NOTE — Therapy (Signed)
[facility] Physical Therapy [address] [place], [place], [address] Phone: [phone]   Fax:  [phone]  Physical Therapy Treatment  Patient Details  Name: Tracy Zuniga MRN: [id number] Date of Birth: [birth year] Referring Provider (PT): Tarry Kos, MD   Encounter Date: 10/02/2019  PT End of Session - 10/02/19 1053    Visit Number  4    Number of Visits  12    Date for PT Re-Evaluation  10/30/19    PT Start Time  0933    PT Stop Time  1013    PT Time Calculation (min)  40 min    Activity Tolerance  Patient tolerated treatment well    Behavior During Therapy  [facility] for tasks assessed/performed       Past Medical History:  Diagnosis Date  . Allergy   . Depression   . Osteopenia 07/03/2019  . Thromboembolism (HCC)    colonic, due to birth control pill  . Vasomotor flushing 07/03/2019    Past Surgical History:  Procedure Laterality Date  . ABDOMINAL HYSTERECTOMY  1999   Partial    There were no vitals filed for this visit.  Subjective Assessment - 10/02/19 0938    Subjective  shoulder is better.  may be open to DN on friday.    Patient Stated Goals  improve pain; restore normal function of Rt arm    Currently in Pain?  No/denies    Pain Onset  More than a month ago                       [facility] Adult PT Treatment/Exercise - 10/02/19 0939      Shoulder Exercises: Standing   Horizontal ABduction  Left;15 reps;Theraband    Theraband Level (Shoulder Horizontal ABduction)  Level 4 (Blue)    Internal Rotation  15 reps;Theraband;Right    Theraband Level (Shoulder Internal Rotation)  Level 4 (Blue)    Extension  Both;15 reps;Theraband    Theraband Level (Shoulder Extension)  Level 4 (Blue)    Row  Both;15 reps;Theraband    Theraband Level (Shoulder Row)  Level 4 (Blue)      Shoulder Exercises: ROM/Strengthening   UBE (Upper Arm Bike)  L2 x 4 min (2' each direction)      Manual Therapy   Manual Therapy  Soft tissue  mobilization    Soft tissue mobilization  Rt deltoid and triceps       Trigger Point Dry Needling - 10/02/19 1053    Consent Given?  Yes    Education Handout Provided  Yes    Muscles Treated Upper Quadrant  Deltoid;Triceps    Deltoid Response  Twitch response elicited;Palpable increased muscle length    Triceps Response  Twitch response elicited;Palpable increased muscle length                PT Long Term Goals - 09/18/19 1259      PT LONG TERM GOAL #1   Title  independent with HEP    Status  New    Target Date  10/30/19      PT LONG TERM GOAL #2   Title  perform Rt shoulder AROM without pain for improved function    Status  New    Target Date  10/30/19      PT LONG TERM GOAL #3   Title  report no difficulty with carrying > 15# for improved function    Status  New    Target Date  10/30/19      PT LONG TERM GOAL #4   Title  report pain < 3/10 with overhead reaching and external rotation with abduction for improved function    Status  New    Target Date  10/30/19      PT LONG TERM GOAL #5   Title  demonstrate 4/5 Rt shoulder strength for improved function    Status  New    Target Date  10/30/19            Plan - 10/02/19 1053    Clinical Impression Statement  Pt with positive response to DN today with decreased referred pain towards elbow following session.  Progressing well with PT at this time.    Examination-Activity Limitations  Reach Overhead;Carry;Toileting;Dressing;Hygiene/Grooming    Examination-Participation Restrictions  Driving;Other   occupation   Stability/Clinical Decision Making  Stable/Uncomplicated    Rehab Potential  Good    PT Frequency  2x / week    PT Duration  6 weeks    PT Treatment/Interventions  ADLs/Self Care Home Management;Cryotherapy;Electrical Stimulation;Ultrasound;Moist Heat;Iontophoresis 4mg /ml Dexamethasone;Functional mobility training;Therapeutic activities;Therapeutic exercise;Patient/family education;Manual  techniques;Passive range of motion;Taping;Vasopneumatic Device;Dry needling    PT Next Visit Plan  review HEP, progress strengthening as able; manual/modalities PRN, assess response to DN    PT Home Exercise Plan  Access Code: [email or web address]    Consulted and Agree with Plan of Care  Patient       Patient will benefit from skilled therapeutic intervention in order to improve the following deficits and impairments:  Decreased range of motion, Increased fascial restricitons, Increased muscle spasms, Pain, Impaired UE functional use, Postural dysfunction, Decreased strength  Visit Diagnosis: Acute pain of right shoulder  Abnormal posture  Muscle weakness (generalized)     Problem List Patient Active Problem List   Diagnosis Date Noted  . Vasomotor flushing 07/03/2019  . Osteopenia 07/03/2019  . Thromboembolism [facility])       Laureen Abrahams, PT, DPT 10/02/19 10:55 AM     [facility] Physical Therapy [address] [place], [place], [address] Phone: [phone]   Fax:  [phone]  Name: Sergio Hobart MRN: [id number] Date of Birth: 03/13/1961

## 2019-10-04 ENCOUNTER — Ambulatory Visit (INDEPENDENT_AMBULATORY_CARE_PROVIDER_SITE_OTHER): Payer: BC Managed Care – PPO | Admitting: Physical Therapy

## 2019-10-04 ENCOUNTER — Encounter: Payer: Self-pay | Admitting: Physical Therapy

## 2019-10-04 ENCOUNTER — Other Ambulatory Visit: Payer: Self-pay

## 2019-10-04 DIAGNOSIS — M6281 Muscle weakness (generalized): Secondary | ICD-10-CM | POA: Diagnosis not present

## 2019-10-04 DIAGNOSIS — R293 Abnormal posture: Secondary | ICD-10-CM

## 2019-10-04 DIAGNOSIS — M25511 Pain in right shoulder: Secondary | ICD-10-CM | POA: Diagnosis not present

## 2019-10-04 NOTE — Therapy (Signed)
[facility] Physical Therapy [address] [place], [place], [address] Phone: [phone]   Fax:  [phone]  Physical Therapy Treatment  Patient Details  Name: Tracy Zuniga MRN: [id number] Date of Birth: 1961/07/28 Referring Provider (PT): Leandrew Koyanagi, MD   Encounter Date: 10/04/2019  PT End of Session - 10/04/19 1009    Visit Number  5    Number of Visits  12    Date for PT Re-Evaluation  10/30/19    PT Start Time  0930    PT Stop Time  1008    PT Time Calculation (min)  38 min    Activity Tolerance  Patient tolerated treatment well    Behavior During Therapy  [facility] for tasks assessed/performed       Past Medical History:  Diagnosis Date  . Allergy   . Depression   . Osteopenia 07/03/2019  . Thromboembolism ([place])    colonic, due to birth control pill  . Vasomotor flushing 07/03/2019    Past Surgical History:  Procedure Laterality Date  . ABDOMINAL HYSTERECTOMY  1999   Partial    There were no vitals filed for this visit.  Subjective Assessment - 10/04/19 0934    Subjective  didn't notice much difference following DN. "Maybe my motion is better, still having pain in arm down to elbow."    Patient Stated Goals  improve pain; restore normal function of Rt arm    Currently in Pain?  --   no pain, feels symptoms into elbow   Pain Onset  More than a month ago                       [facility] Adult PT Treatment/Exercise - 10/04/19 0935      Shoulder Exercises: ROM/Strengthening   UBE (Upper Arm Bike)  L2 x 4 min (2' each direction)      Shoulder Exercises: Stretch   Cross Chest Stretch  3 reps;30 seconds    Other Shoulder Stretches  low/mid/high doorway stretch 2x30 sec      Manual Therapy   Manual Therapy  Soft tissue mobilization    Soft tissue mobilization  Rt tricep into infraspinatus and teres minor       Trigger Point Dry Needling - 10/04/19 1007    Muscles Treated Upper Quadrant  Infraspinatus;Teres minor    Infraspinatus Response  Twitch response elicited;Palpable increased muscle length    Teres minor Response  Palpable increased muscle length;Twitch response elicited           PT Education - 10/04/19 1008    Education Details  HEP    Person(s) Educated  Patient    Methods  Explanation;Demonstration;Handout    Comprehension  Verbalized understanding;Returned demonstration          PT Long Term Goals - 09/18/19 1259      PT LONG TERM GOAL #1   Title  independent with HEP    Status  New    Target Date  10/30/19      PT LONG TERM GOAL #2   Title  perform Rt shoulder AROM without pain for improved function    Status  New    Target Date  10/30/19      PT LONG TERM GOAL #3   Title  report no difficulty with carrying > 15# for improved function    Status  New    Target Date  10/30/19      PT LONG TERM GOAL #4  Title  report pain < 3/10 with overhead reaching and external rotation with abduction for improved function    Status  New    Target Date  10/30/19      PT LONG TERM GOAL #5   Title  demonstrate 4/5 Rt shoulder strength for improved function    Status  New    Target Date  10/30/19            Plan - 10/04/19 1009    Clinical Impression Statement  Pt with reproduction of referred pain into RUE with DN to teres and infraspinatus today.  Overall improving, still with some mild symptoms into RUE.  Will continue to benefit from PT to maximize function.    Examination-Activity Limitations  Reach Overhead;Carry;Toileting;Dressing;Hygiene/Grooming    Examination-Participation Restrictions  Driving;Other   occupation   Stability/Clinical Decision Making  Stable/Uncomplicated    Rehab Potential  Good    PT Frequency  2x / week    PT Duration  6 weeks    PT Treatment/Interventions  ADLs/Self Care Home Management;Cryotherapy;Electrical Stimulation;Ultrasound;Moist Heat;Iontophoresis 4mg /ml Dexamethasone;Functional mobility training;Therapeutic activities;Therapeutic  exercise;Patient/family education;Manual techniques;Passive range of motion;Taping;Vasopneumatic Device;Dry needling    PT Next Visit Plan  review HEP, progress strengthening as able; manual/modalities PRN, assess response to DN    PT Home Exercise Plan  Access Code:    Consulted and Agree with Plan of Care  Patient       Patient will benefit from skilled therapeutic intervention in order to improve the following deficits and impairments:  Decreased range of motion, Increased fascial restricitons, Increased muscle spasms, Pain, Impaired UE functional use, Postural dysfunction, Decreased strength  Visit Diagnosis: Acute pain of right shoulder  Abnormal posture  Muscle weakness (generalized)     Problem List Patient Active Problem List   Diagnosis Date Noted  . Vasomotor flushing 07/03/2019  . Osteopenia 07/03/2019  . Thromboembolism (HCC)       07/05/2019, PT, DPT 10/04/19 10:11 AM     [facility] Physical Therapy [address] [place], [place], [place] Phone: [phone]   Fax:  [phone]  Name: Tracy Zuniga MRN: Lacie Draft Date of Birth: 11/21/1960

## 2019-10-04 NOTE — Patient Instructions (Signed)
Access Code: [email or web address]  URL: [email or web address]  Date: 10/04/2019  Prepared by: Faustino Congress   Exercises  Doorway Pec Stretch at 90 Degrees Abduction - 3 reps - 1 sets - 30 sec hold - 1x daily - 7x weekly  Supine Chest Stretch on Foam Roll - 1 reps - 1 sets - 3-5 min hold - 1x daily - 7x weekly  Seated Scapular Retraction - 2-3 reps - 1 sets - 5 sec hold - 7x daily - 7x weekly  Shoulder External Rotation and Scapular Retraction - 2-3 reps - 1 sets - 1-2 sec hold - 7x daily - 7x weekly  Standing Backward Shoulder Rolls - 2-3 reps - 1 sets - 7x daily - 7x weekly  Scapular Retraction with Resistance - 10 reps - 1 sets - 5 sec hold - 2x daily - 7x weekly  Standing Shoulder External Rotation with Resistance - 10 reps - 1 sets - 1-2 sec hold - 1x daily - 7x weekly  Single Arm Shoulder Flexion with Dumbbell - 10 reps - 1 sets - 1x daily - 7x weekly  Shoulder Abduction with Dumbbells - Thumbs Up - 10 reps - 1 sets - 1x daily - 7x weekly  Standing Shoulder Horizontal Abduction with Dumbbells - Thumbs Up - 10 reps - 1 sets - 1x daily - 7x weekly  Standing Shoulder Posterior Capsule Stretch - 3 reps - 1 sets - 20-30 sec hold - 1x daily - 7x weekly  Patient Education  Trigger Point Dry Needling

## 2019-10-09 ENCOUNTER — Encounter: Payer: Self-pay | Admitting: Physical Therapy

## 2019-10-09 ENCOUNTER — Other Ambulatory Visit: Payer: Self-pay

## 2019-10-09 ENCOUNTER — Ambulatory Visit (INDEPENDENT_AMBULATORY_CARE_PROVIDER_SITE_OTHER): Payer: BC Managed Care – PPO | Admitting: Physical Therapy

## 2019-10-09 DIAGNOSIS — M6281 Muscle weakness (generalized): Secondary | ICD-10-CM

## 2019-10-09 DIAGNOSIS — R293 Abnormal posture: Secondary | ICD-10-CM

## 2019-10-09 DIAGNOSIS — M25511 Pain in right shoulder: Secondary | ICD-10-CM | POA: Diagnosis not present

## 2019-10-09 NOTE — Therapy (Signed)
[facility] Physical Therapy [address] [place], [place], [address] Phone: [phone]   Fax:  [phone]  Physical Therapy Treatment  Patient Details  Name: Tracy Zuniga MRN: [id number] Date of Birth: 12-03-1960 Referring Provider (PT): Tarry Kos, MD   Encounter Date: 10/09/2019  PT End of Session - 10/09/19 1251    Visit Number  6    Number of Visits  12    Date for PT Re-Evaluation  10/30/19    PT Start Time  0932    PT Stop Time  1010    PT Time Calculation (min)  38 min    Activity Tolerance  Patient tolerated treatment well    Behavior During Therapy  [facility] for tasks assessed/performed       Past Medical History:  Diagnosis Date  . Allergy   . Depression   . Osteopenia 07/03/2019  . Thromboembolism (HCC)    colonic, due to birth control pill  . Vasomotor flushing 07/03/2019    Past Surgical History:  Procedure Laterality Date  . ABDOMINAL HYSTERECTOMY  1999   Partial    There were no vitals filed for this visit.  Subjective Assessment - 10/09/19 0938    Subjective  feels back to normal function; still has slight occasional twinge into arm with overhead reaching but significantly improved in intensity (not consistent with each time).    Patient Stated Goals  improve pain; restore normal function of Rt arm    Currently in Pain?  Yes    Pain Onset  More than a month ago                       [facility] Adult PT Treatment/Exercise - 10/09/19 0939      Shoulder Exercises: ROM/Strengthening   UBE (Upper Arm Bike)  L2 x 4 min (2' each direction)      Manual Therapy   Manual Therapy  Soft tissue mobilization    Soft tissue mobilization  Rt infraspinatus and teres minor       Trigger Point Dry Needling - 10/09/19 1251    Consent Given?  Yes    Education Handout Provided  Previously provided    Muscles Treated Upper Quadrant  Infraspinatus;Teres minor    Electrical Stimulation Performed with Dry Needling  Yes    E-stim  with Dry Needling Details  to tolerance freq x 6 min    Infraspinatus Response  Twitch response elicited;Palpable increased muscle length    Teres minor Response  Palpable increased muscle length;Twitch response elicited                PT Long Term Goals - 09/18/19 1259      PT LONG TERM GOAL #1   Title  independent with HEP    Status  New    Target Date  10/30/19      PT LONG TERM GOAL #2   Title  perform Rt shoulder AROM without pain for improved function    Status  New    Target Date  10/30/19      PT LONG TERM GOAL #3   Title  report no difficulty with carrying > 15# for improved function    Status  New    Target Date  10/30/19      PT LONG TERM GOAL #4   Title  report pain < 3/10 with overhead reaching and external rotation with abduction for improved function    Status  New  Target Date  10/30/19      PT LONG TERM GOAL #5   Title  demonstrate 4/5 Rt shoulder strength for improved function    Status  New    Target Date  10/30/19            Plan - 10/09/19 1252    Clinical Impression Statement  Pt reports pain significantly improved and only experiencing intermittent mild pain with overhead reaching.  Overall progressing well with PT.    Examination-Activity Limitations  Reach Overhead;Carry;Toileting;Dressing;Hygiene/Grooming    Examination-Participation Restrictions  Driving;Other   occupation   Stability/Clinical Decision Making  Stable/Uncomplicated    Rehab Potential  Good    PT Frequency  2x / week    PT Duration  6 weeks    PT Treatment/Interventions  ADLs/Self Care Home Management;Cryotherapy;Electrical Stimulation;Ultrasound;Moist Heat;Iontophoresis 4mg /ml Dexamethasone;Functional mobility training;Therapeutic activities;Therapeutic exercise;Patient/family education;Manual techniques;Passive range of motion;Taping;Vasopneumatic Device;Dry needling    PT Next Visit Plan  review HEP, progress strengthening as able; manual/modalities PRN,  assess response to DN; look at goals (?ready for d/c?)    PT Home Exercise Plan  Access Code: [email or web address]    Consulted and Agree with Plan of Care  Patient       Patient will benefit from skilled therapeutic intervention in order to improve the following deficits and impairments:  Decreased range of motion, Increased fascial restricitons, Increased muscle spasms, Pain, Impaired UE functional use, Postural dysfunction, Decreased strength  Visit Diagnosis: Acute pain of right shoulder  Abnormal posture  Muscle weakness (generalized)     Problem List Patient Active Problem List   Diagnosis Date Noted  . Vasomotor flushing 07/03/2019  . Osteopenia 07/03/2019  . Thromboembolism [facility])       Laureen Abrahams, PT, DPT 10/09/19 12:55 PM     [facility] Physical Therapy [address] [place], [place], [address] Phone: [phone]   Fax:  [phone]  Name: Tracy Zuniga MRN: [id number] Date of Birth: [birth year]

## 2019-10-16 ENCOUNTER — Other Ambulatory Visit: Payer: Self-pay

## 2019-10-16 ENCOUNTER — Encounter: Payer: Self-pay | Admitting: Physical Therapy

## 2019-10-16 ENCOUNTER — Ambulatory Visit (INDEPENDENT_AMBULATORY_CARE_PROVIDER_SITE_OTHER): Payer: BC Managed Care – PPO | Admitting: Physical Therapy

## 2019-10-16 DIAGNOSIS — M25511 Pain in right shoulder: Secondary | ICD-10-CM | POA: Diagnosis not present

## 2019-10-16 DIAGNOSIS — M6281 Muscle weakness (generalized): Secondary | ICD-10-CM

## 2019-10-16 DIAGNOSIS — R293 Abnormal posture: Secondary | ICD-10-CM | POA: Diagnosis not present

## 2019-10-16 NOTE — Therapy (Addendum)
[facility] Physical Therapy [address] [place], [place], [address] Phone: [phone]   Fax:  [phone]  Physical Therapy Treatment/Discharge Summary  Patient Details  Name: Tracy Zuniga MRN: [id number] Date of Birth: February 15, 1961 Referring Provider (PT): Leandrew Koyanagi, MD   Encounter Date: 10/16/2019  PT End of Session - 10/16/19 1008    Visit Number  7    Number of Visits  12    Date for PT Re-Evaluation  10/30/19    PT Start Time  0930    PT Stop Time  1008    PT Time Calculation (min)  38 min    Activity Tolerance  Patient tolerated treatment well    Behavior During Therapy  [facility] for tasks assessed/performed       Past Medical History:  Diagnosis Date  . Allergy   . Depression   . Osteopenia 07/03/2019  . Thromboembolism ([place])    colonic, due to birth control pill  . Vasomotor flushing 07/03/2019    Past Surgical History:  Procedure Laterality Date  . ABDOMINAL HYSTERECTOMY  1999   Partial    There were no vitals filed for this visit.  Subjective Assessment - 10/16/19 0936    Subjective  shoulder feels much better; "it's not fully healed, but it's a lot better."    Patient Stated Goals  improve pain; restore normal function of Rt arm    Currently in Pain?  No/denies    Pain Onset  More than a month ago         [facility] PT Assessment - 10/16/19 0957      Assessment   Medical Diagnosis  M25.511,G89.29 (ICD-10-CM) - Chronic right shoulder pain    Referring Provider (PT)  Leandrew Koyanagi, MD    Hand Dominance  Right      AROM   Overall AROM Comments  WNL without pain; reports tightness only      Strength   Right Shoulder Flexion  4/5    Right Shoulder ABduction  4/5    Right Shoulder Internal Rotation  5/5    Right Shoulder External Rotation  5/5                   [facility] Adult PT Treatment/Exercise - 10/16/19 0937      Shoulder Exercises: Standing   External Rotation  Both;10 reps;Theraband    Theraband Level  (Shoulder External Rotation)  Level 4 (Blue)    Internal Rotation  15 reps;Theraband;Right    Theraband Level (Shoulder Internal Rotation)  Level 4 (Blue)    Row  Both;15 reps;Theraband    Theraband Level (Shoulder Row)  Level 4 (Blue)      Shoulder Exercises: ROM/Strengthening   UBE (Upper Arm Bike)  L2 x 4 min (2' each direction)      Shoulder Exercises: Stretch   Other Shoulder Stretches  low/mid/high doorway stretch 2x30 sec      Manual Therapy   Manual Therapy  Soft tissue mobilization    Soft tissue mobilization  Rt supraspinatus and upper trap       Trigger Point Dry Needling - 10/16/19 1007    Consent Given?  Yes    Education Handout Provided  Previously provided    Muscles Treated Upper Quadrant  Supraspinatus    Supraspinatus Response  Twitch response elicited;Palpable increased muscle length                PT Long Term Goals - 09/18/19 1259  PT LONG TERM GOAL #1   Title  independent with HEP    Status  New    Target Date  10/30/19      PT LONG TERM GOAL #2   Title  perform Rt shoulder AROM without pain for improved function    Status  New    Target Date  10/30/19      PT LONG TERM GOAL #3   Title  report no difficulty with carrying > 15# for improved function    Status  New    Target Date  10/30/19      PT LONG TERM GOAL #4   Title  report pain < 3/10 with overhead reaching and external rotation with abduction for improved function    Status  New    Target Date  10/30/19      PT LONG TERM GOAL #5   Title  demonstrate 4/5 Rt shoulder strength for improved function    Status  New    Target Date  10/30/19       Addendum 1/6: per assessment; pt has met all goals, forgot to update status during appt. Laureen Abrahams, PT, DPT 11/20/19 10:57 AM      Plan - 10/16/19 1008    Clinical Impression Statement  Pt has met all goals and is doing siginificanly better at this time.  Requesting hold from PT, and will d/c if pt doesn't return  in ~ 1 month.  If needed, will reassess and tx.    Examination-Activity Limitations  Reach Overhead;Carry;Toileting;Dressing;Hygiene/Grooming    Examination-Participation Restrictions  Driving;Other   occupation   Stability/Clinical Decision Making  Stable/Uncomplicated    Rehab Potential  Good    PT Frequency  2x / week    PT Duration  6 weeks    PT Treatment/Interventions  ADLs/Self Care Home Management;Cryotherapy;Electrical Stimulation;Ultrasound;Moist Heat;Iontophoresis 8m/ml Dexamethasone;Functional mobility training;Therapeutic activities;Therapeutic exercise;Patient/family education;Manual techniques;Passive range of motion;Taping;Vasopneumatic Device;Dry needling    PT Next Visit Plan  hold x 30 days; d/c if pt doesn't return, otherwise reassess    PT Home Exercise Plan  Access Code: [email or web address]   Consulted and Agree with Plan of Care  Patient       Patient will benefit from skilled therapeutic intervention in order to improve the following deficits and impairments:  Decreased range of motion, Increased fascial restricitons, Increased muscle spasms, Pain, Impaired UE functional use, Postural dysfunction, Decreased strength  Visit Diagnosis: Acute pain of right shoulder  Abnormal posture  Muscle weakness (generalized)     Problem List Patient Active Problem List   Diagnosis Date Noted  . Vasomotor flushing 07/03/2019  . Osteopenia 07/03/2019  . [place]       SLaureen Abrahams PT, DPT 10/16/19 10:10 AM    [facility] [address] [address]Phone: [phone]  Fax:  [phone] Name: Tracy HieronymusMRN: [id number] of Birth: 101-10-1961    PHYSICAL THERAPY DISCHARGE SUMMARY  Visits from Start of Care: 7  Current functional level related to goals / functional outcomes: See above   Remaining deficits: See above   Education / Equipment: HEP  Plan: Patient agrees to  discharge.  Patient goals were met. Patient is being discharged due to meeting the stated rehab goals.  ?????    SLaureen Abrahams PT, DPT 11/20/19 10:58 AM  [facility] LLCPhysical Therapy [address] [address]Phone: [phone]  Fax:  [phone]

## 2019-10-18 ENCOUNTER — Ambulatory Visit (INDEPENDENT_AMBULATORY_CARE_PROVIDER_SITE_OTHER): Payer: 59 | Admitting: Psychology

## 2019-10-18 ENCOUNTER — Encounter: Payer: BC Managed Care – PPO | Admitting: Physical Therapy

## 2019-10-18 DIAGNOSIS — F4321 Adjustment disorder with depressed mood: Secondary | ICD-10-CM

## 2019-10-23 ENCOUNTER — Encounter: Payer: BC Managed Care – PPO | Admitting: Physical Therapy

## 2019-10-25 ENCOUNTER — Encounter: Payer: BC Managed Care – PPO | Admitting: Physical Therapy

## 2019-11-06 ENCOUNTER — Ambulatory Visit (INDEPENDENT_AMBULATORY_CARE_PROVIDER_SITE_OTHER): Payer: 59 | Admitting: Psychology

## 2019-11-06 DIAGNOSIS — F4321 Adjustment disorder with depressed mood: Secondary | ICD-10-CM | POA: Diagnosis not present

## 2019-12-06 ENCOUNTER — Ambulatory Visit: Payer: 59 | Admitting: Psychology

## 2020-01-25 DIAGNOSIS — Z03818 Encounter for observation for suspected exposure to other biological agents ruled out: Secondary | ICD-10-CM | POA: Diagnosis not present

## 2020-01-25 DIAGNOSIS — Z20828 Contact with and (suspected) exposure to other viral communicable diseases: Secondary | ICD-10-CM | POA: Diagnosis not present

## 2021-05-03 IMAGING — DX DG SHOULDER 2+V*R*
4 series · 4 of 4 positions shown · non-contrast
Comparison: None.

CLINICAL DATA: Right shoulder pain after fall 2 weeks ago

EXAM:
RIGHT SHOULDER - 2+ VIEW

[shoulder ap]
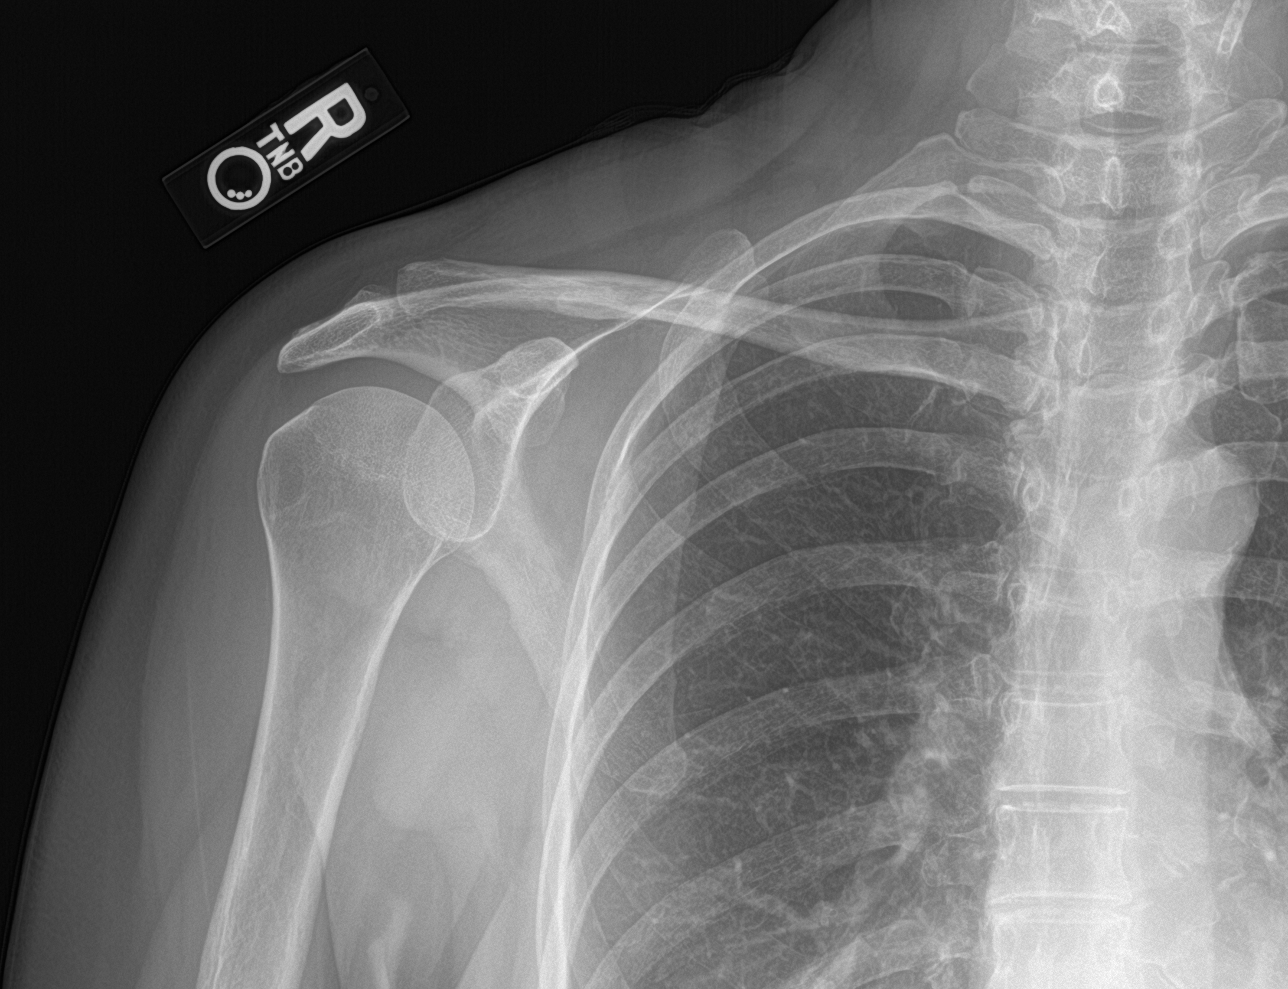

[shoulder grashey]
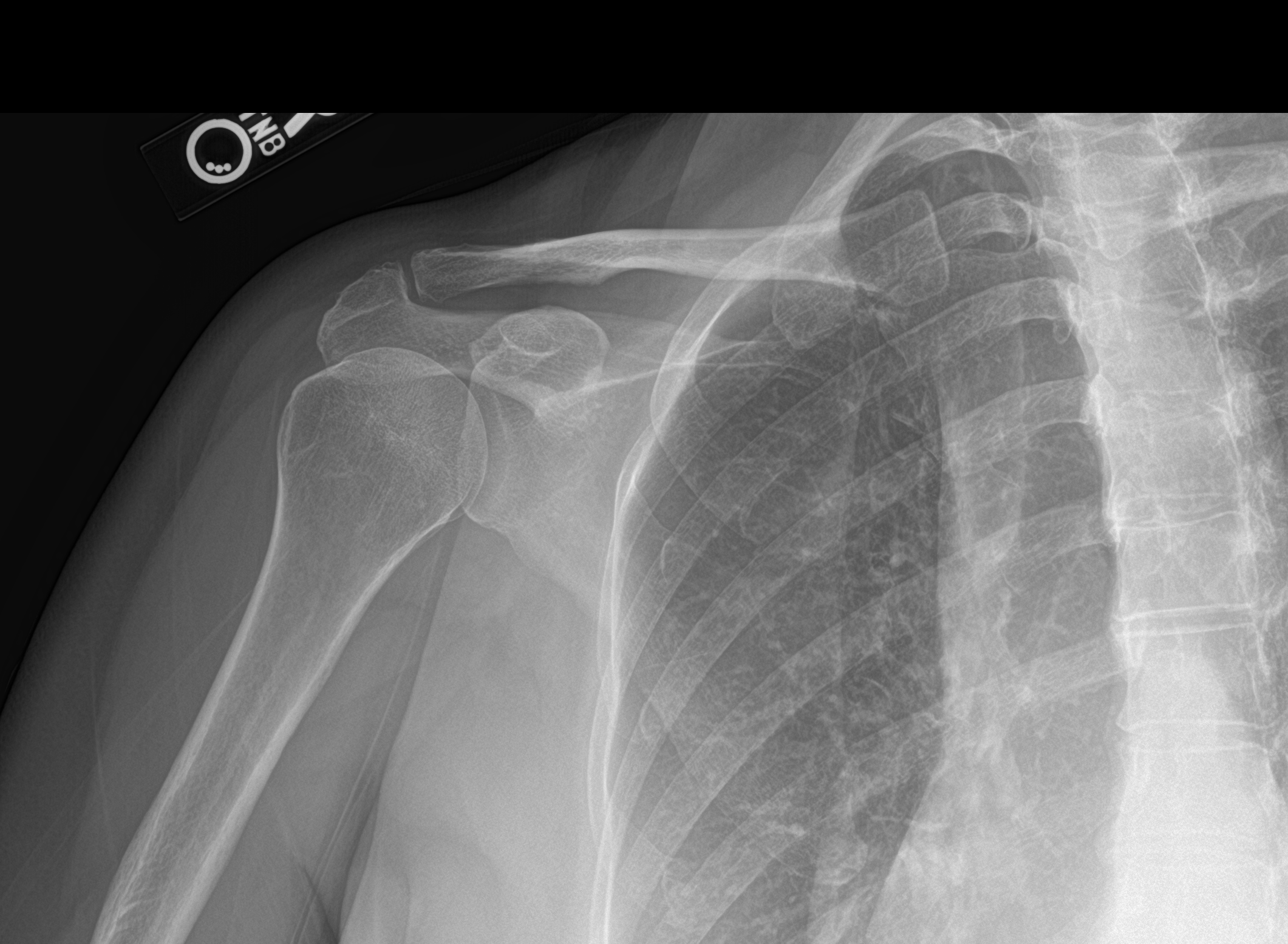

[shoulder y-view]
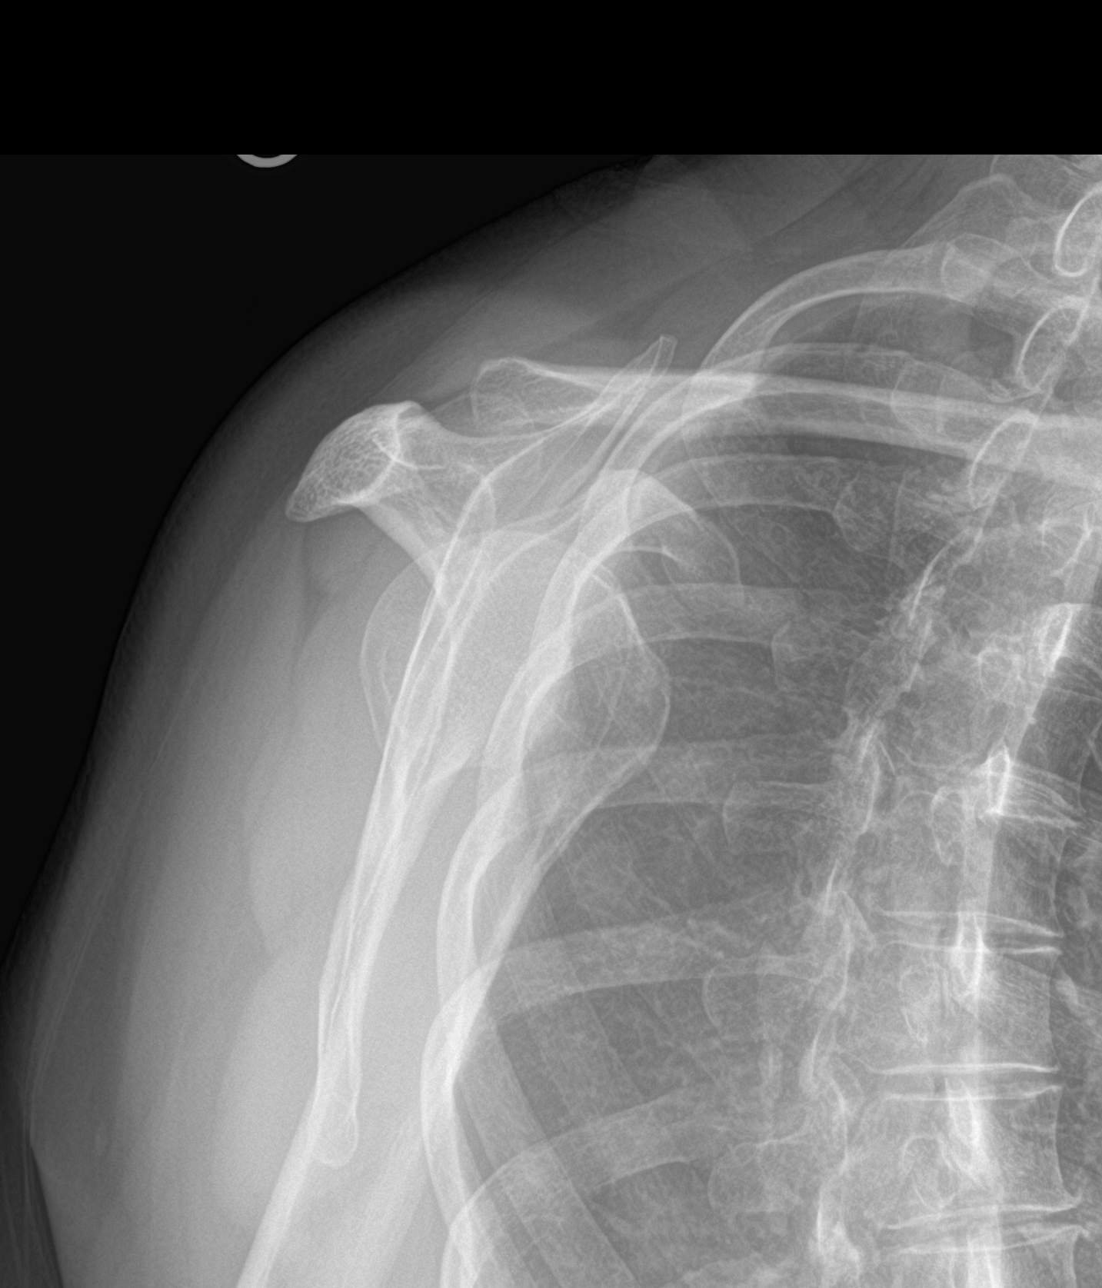

[shoulder axial]
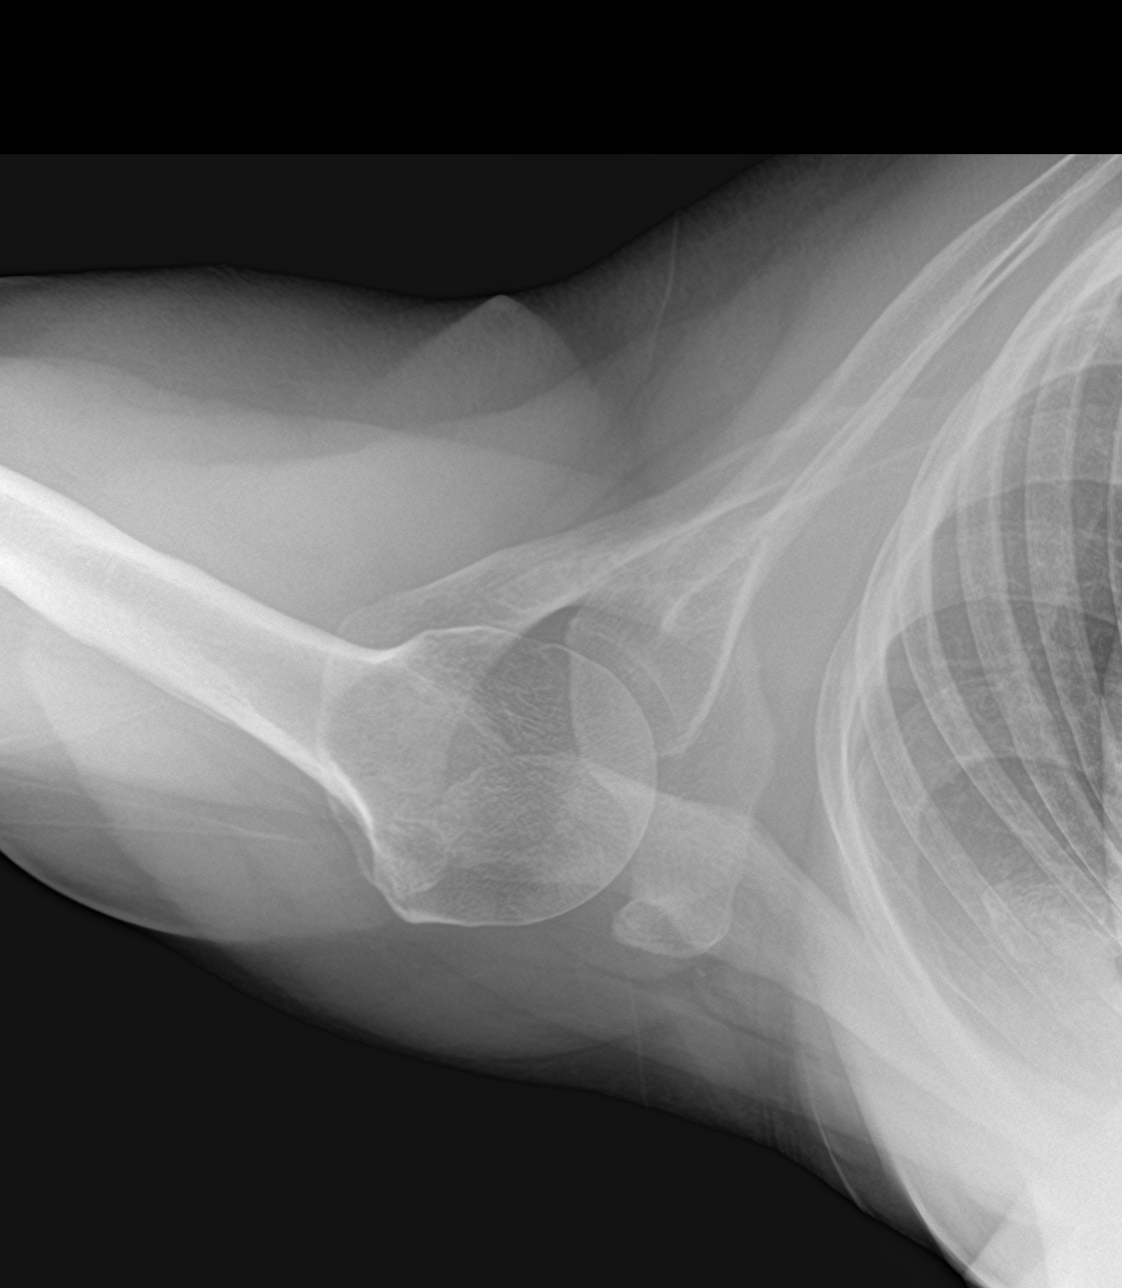

[4 of 4 positions shown; findings below may reference images not displayed]

FINDINGS: There is no evidence of fracture or dislocation. There is no
evidence of arthropathy or other focal bone abnormality. Soft
tissues are unremarkable.
IMPRESSION: Negative.

## 2022-08-08 ENCOUNTER — Encounter: Payer: Self-pay | Admitting: *Deleted

## 2022-10-27 ENCOUNTER — Encounter: Payer: Self-pay | Admitting: *Deleted
